# Patient Record
Sex: Male | Born: 1955 | Race: White | Hispanic: No | Marital: Married | Smoking: Never smoker
Health system: Southern US, Community
[De-identification: ages and names within clinical notes are randomized; demographics above are authoritative.]

## PROBLEM LIST (undated history)

## (undated) DIAGNOSIS — M1990 Unspecified osteoarthritis, unspecified site: Secondary | ICD-10-CM

## (undated) HISTORY — PX: COSMETIC SURGERY: SHX468

## (undated) HISTORY — DX: Unspecified osteoarthritis, unspecified site: M19.90

## (undated) HISTORY — PX: HERNIA REPAIR: SHX51

## (undated) HISTORY — PX: BACK SURGERY: SHX140

## (undated) HISTORY — PX: VARICOCELE EXCISION: SUR582

---

## 2005-09-09 ENCOUNTER — Emergency Department (HOSPITAL_COMMUNITY): Admission: EM | Admit: 2005-09-09 | Discharge: 2005-09-10 | Payer: Self-pay | Admitting: Emergency Medicine

## 2006-03-20 ENCOUNTER — Emergency Department (HOSPITAL_COMMUNITY): Admission: EM | Admit: 2006-03-20 | Discharge: 2006-03-20 | Payer: Self-pay | Admitting: Family Medicine

## 2006-04-09 ENCOUNTER — Emergency Department (HOSPITAL_COMMUNITY): Admission: EM | Admit: 2006-04-09 | Discharge: 2006-04-09 | Payer: Self-pay | Admitting: Emergency Medicine

## 2008-05-12 ENCOUNTER — Emergency Department (HOSPITAL_COMMUNITY): Admission: EM | Admit: 2008-05-12 | Discharge: 2008-05-12 | Payer: Self-pay | Admitting: Emergency Medicine

## 2008-05-16 ENCOUNTER — Encounter: Admission: RE | Admit: 2008-05-16 | Discharge: 2008-05-16 | Payer: Self-pay | Admitting: Orthopedic Surgery

## 2008-05-17 ENCOUNTER — Encounter: Admission: RE | Admit: 2008-05-17 | Discharge: 2008-05-17 | Payer: Self-pay | Admitting: Orthopedic Surgery

## 2008-07-11 ENCOUNTER — Ambulatory Visit: Payer: Self-pay | Admitting: Family Medicine

## 2008-07-11 DIAGNOSIS — M1990 Unspecified osteoarthritis, unspecified site: Secondary | ICD-10-CM | POA: Insufficient documentation

## 2008-07-12 ENCOUNTER — Ambulatory Visit: Payer: Self-pay | Admitting: Family Medicine

## 2008-07-13 LAB — CONVERTED CEMR LAB
ALT: 27 units/L (ref 0–53)
AST: 23 units/L (ref 0–37)
Albumin: 3.8 g/dL (ref 3.5–5.2)
Alkaline Phosphatase: 56 units/L (ref 39–117)
BUN: 13 mg/dL (ref 6–23)
Bilirubin, Direct: 0.1 mg/dL (ref 0.0–0.3)
CO2: 32 meq/L (ref 19–32)
Calcium: 8.9 mg/dL (ref 8.4–10.5)
Chloride: 105 meq/L (ref 96–112)
Cholesterol: 141 mg/dL (ref 0–200)
Creatinine, Ser: 0.9 mg/dL (ref 0.4–1.5)
GFR calc Af Amer: 114 mL/min
GFR calc non Af Amer: 95 mL/min
Glucose, Bld: 89 mg/dL (ref 70–99)
HDL: 39.4 mg/dL (ref >39.0)
LDL Cholesterol: 90 mg/dL (ref 0–99)
Potassium: 4.1 meq/L (ref 3.5–5.1)
Sodium: 142 meq/L (ref 135–145)
Total Bilirubin: 0.8 mg/dL (ref 0.3–1.2)
Total CHOL/HDL Ratio: 3.6
Total Protein: 6.4 g/dL (ref 6.0–8.3)
Triglycerides: 57 mg/dL (ref 0–149)
VLDL: 11 mg/dL (ref 0–40)

## 2008-07-22 ENCOUNTER — Encounter: Payer: Self-pay | Admitting: Family Medicine

## 2008-07-28 ENCOUNTER — Encounter: Payer: Self-pay | Admitting: Family Medicine

## 2008-07-28 LAB — HM COLONOSCOPY: HM Colonoscopy: ABNORMAL

## 2008-07-29 ENCOUNTER — Encounter: Payer: Self-pay | Admitting: Family Medicine

## 2008-08-05 ENCOUNTER — Encounter: Admission: RE | Admit: 2008-08-05 | Discharge: 2008-08-05 | Payer: Self-pay | Admitting: Orthopedic Surgery

## 2009-03-03 ENCOUNTER — Ambulatory Visit: Payer: Self-pay | Admitting: Family Medicine

## 2009-03-03 LAB — CONVERTED CEMR LAB
Bilirubin Urine: NEGATIVE
Blood in Urine, dipstick: NEGATIVE
Glucose, Urine, Semiquant: NEGATIVE
Ketones, urine, test strip: NEGATIVE
Nitrite: NEGATIVE
Specific Gravity, Urine: 1.02
Urobilinogen, UA: 0.2
WBC Urine, dipstick: NEGATIVE
pH: 6.5

## 2009-04-05 ENCOUNTER — Ambulatory Visit: Payer: Self-pay | Admitting: Family Medicine

## 2009-04-06 ENCOUNTER — Encounter: Payer: Self-pay | Admitting: Family Medicine

## 2009-09-08 ENCOUNTER — Telehealth: Payer: Self-pay | Admitting: Family Medicine

## 2009-09-15 ENCOUNTER — Ambulatory Visit: Payer: Self-pay | Admitting: Family Medicine

## 2009-09-15 LAB — CONVERTED CEMR LAB
ALT: 30 units/L (ref 0–53)
AST: 23 units/L (ref 0–37)
Albumin: 4.1 g/dL (ref 3.5–5.2)
Alkaline Phosphatase: 55 units/L (ref 39–117)
BUN: 16 mg/dL (ref 6–23)
Bilirubin, Direct: 0.1 mg/dL (ref 0.0–0.3)
CO2: 32 meq/L (ref 19–32)
Calcium: 9.4 mg/dL (ref 8.4–10.5)
Chloride: 105 meq/L (ref 96–112)
Cholesterol: 149 mg/dL (ref 0–200)
Creatinine, Ser: 1.1 mg/dL (ref 0.4–1.5)
GFR calc non Af Amer: 74.62 mL/min (ref >60)
Glucose, Bld: 87 mg/dL (ref 70–99)
HDL: 47.4 mg/dL (ref >39.00)
LDL Cholesterol: 92 mg/dL (ref 0–99)
Potassium: 4.3 meq/L (ref 3.5–5.1)
Sodium: 141 meq/L (ref 135–145)
Total Bilirubin: 1.3 mg/dL — ABNORMAL HIGH (ref 0.3–1.2)
Total CHOL/HDL Ratio: 3
Total Protein: 6.8 g/dL (ref 6.0–8.3)
Triglycerides: 48 mg/dL (ref 0.0–149.0)
VLDL: 9.6 mg/dL (ref 0.0–40.0)

## 2009-09-22 ENCOUNTER — Ambulatory Visit: Payer: Self-pay | Admitting: Family Medicine

## 2009-09-22 DIAGNOSIS — I7300 Raynaud's syndrome without gangrene: Secondary | ICD-10-CM | POA: Insufficient documentation

## 2010-10-05 ENCOUNTER — Telehealth (INDEPENDENT_AMBULATORY_CARE_PROVIDER_SITE_OTHER): Payer: Self-pay | Admitting: *Deleted

## 2010-10-09 ENCOUNTER — Ambulatory Visit
Admission: RE | Admit: 2010-10-09 | Discharge: 2010-10-09 | Payer: Self-pay | Source: Home / Self Care | Attending: Family Medicine | Admitting: Family Medicine

## 2010-10-09 ENCOUNTER — Telehealth: Payer: Self-pay | Admitting: Family Medicine

## 2010-10-09 LAB — CONVERTED CEMR LAB
AST: 26 units/L (ref 0–37)
Alkaline Phosphatase: 62 units/L (ref 39–117)
Bilirubin, Direct: 0.2 mg/dL (ref 0.0–0.3)
CO2: 27 meq/L (ref 19–32)
Calcium: 9.1 mg/dL (ref 8.4–10.5)
Chloride: 105 meq/L (ref 96–112)
Creatinine, Ser: 0.8 mg/dL (ref 0.4–1.5)
GFR calc non Af Amer: 102.86 mL/min (ref >60.00)
HDL: 40.9 mg/dL (ref >39.00)
PSA: 0.83 ng/mL (ref 0.10–4.00)
Potassium: 4.3 meq/L (ref 3.5–5.1)
Sodium: 141 meq/L (ref 135–145)
Total CHOL/HDL Ratio: 4
Total Protein: 6.8 g/dL (ref 6.0–8.3)
Triglycerides: 64 mg/dL (ref 0.0–149.0)
Uric Acid, Serum: 4.7 mg/dL (ref 4.0–7.8)
VLDL: 12.8 mg/dL (ref 0.0–40.0)

## 2010-10-12 ENCOUNTER — Ambulatory Visit
Admission: RE | Admit: 2010-10-12 | Discharge: 2010-10-12 | Payer: Self-pay | Source: Home / Self Care | Attending: Family Medicine | Admitting: Family Medicine

## 2010-10-12 DIAGNOSIS — K921 Melena: Secondary | ICD-10-CM | POA: Insufficient documentation

## 2010-10-12 DIAGNOSIS — M654 Radial styloid tenosynovitis [de Quervain]: Secondary | ICD-10-CM | POA: Insufficient documentation

## 2010-10-12 LAB — CONVERTED CEMR LAB
Cholesterol, target level: 200 mg/dL
LDL Goal: 160 mg/dL

## 2010-11-08 NOTE — Progress Notes (Signed)
----   Converted from flag ---- ---- 10/04/2010 10:22 PM, Kerby Nora MD wrote: PSA Dx v76.44, CMET, lipids Dx v77.91  ---- 10/03/2010 9:59 AM, Liane Comber CMA (AAMA) wrote: Lab orders please! Good Morning! This pt is scheduled for cpx labs Monday, which labs to draw and dx codes to use? Thanks Tasha ------------------------------
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
## 2010-11-08 NOTE — Progress Notes (Signed)
Summary: add uric acid  ---- Converted from flag ---- ---- 10/09/2010 9:45 AM, Kerby Nora MD wrote: Dx 729.5 uric acid  ---- 10/09/2010 9:20 AM, Melody Comas wrote: Patient came in for labs this morning and he is asking if we could add a uric acid to his order  because he has been having trouble with his feet and he wants to make sure it's not gout. Please advise. ------------------------------

## 2011-09-27 ENCOUNTER — Encounter: Payer: Self-pay | Admitting: Family Medicine

## 2011-09-27 LAB — PSA: PSA: 1.17 ng/mL

## 2011-10-11 ENCOUNTER — Telehealth: Payer: Self-pay | Admitting: Family Medicine

## 2011-10-11 ENCOUNTER — Other Ambulatory Visit (INDEPENDENT_AMBULATORY_CARE_PROVIDER_SITE_OTHER): Payer: BC Managed Care – PPO

## 2011-10-11 DIAGNOSIS — Z125 Encounter for screening for malignant neoplasm of prostate: Secondary | ICD-10-CM

## 2011-10-11 DIAGNOSIS — Z13220 Encounter for screening for lipoid disorders: Secondary | ICD-10-CM

## 2011-10-11 LAB — COMPREHENSIVE METABOLIC PANEL
ALT: 28 U/L (ref 0–53)
Albumin: 4 g/dL (ref 3.5–5.2)
BUN: 16 mg/dL (ref 6–23)
CO2: 30 mEq/L (ref 19–32)
Calcium: 9.2 mg/dL (ref 8.4–10.5)
Chloride: 105 mEq/L (ref 96–112)
Creatinine, Ser: 0.9 mg/dL (ref 0.4–1.5)
GFR: 92.15 mL/min (ref >60.00)
Glucose, Bld: 92 mg/dL (ref 70–99)
Potassium: 4.4 mEq/L (ref 3.5–5.1)
Sodium: 140 mEq/L (ref 135–145)
Total Bilirubin: 0.7 mg/dL (ref 0.3–1.2)
Total Protein: 6.9 g/dL (ref 6.0–8.3)

## 2011-10-11 LAB — LIPID PANEL
Cholesterol: 165 mg/dL (ref 0–200)
HDL: 50 mg/dL (ref >39.00)
Total CHOL/HDL Ratio: 3
Triglycerides: 69 mg/dL (ref 0.0–149.0)
VLDL: 13.8 mg/dL (ref 0.0–40.0)

## 2011-10-11 NOTE — Telephone Encounter (Signed)
Message copied by Excell Seltzer on Fri Oct 11, 2011  7:08 AM ------      Message from: Mills Koller J      Created: Wed Oct 09, 2011  2:46 PM      Regarding: labs for 10-11-11       Patient is scheduled for CPX labs, please order future labs, Thanks , Camelia Eng

## 2011-10-14 ENCOUNTER — Ambulatory Visit (INDEPENDENT_AMBULATORY_CARE_PROVIDER_SITE_OTHER): Payer: BC Managed Care – PPO | Admitting: Family Medicine

## 2011-10-14 ENCOUNTER — Encounter: Payer: Self-pay | Admitting: Family Medicine

## 2011-10-14 VITALS — BP 120/70 | HR 58 | Temp 98.0°F | Ht 69.5 in | Wt 172.0 lb

## 2011-10-14 DIAGNOSIS — Z0000 Encounter for general adult medical examination without abnormal findings: Secondary | ICD-10-CM
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
## 2011-10-14 NOTE — Patient Instructions (Addendum)
Work on getting regular low impact exercise as tolerated. Work on healthy eating habits. Follow up in 1 year for CPX.

## 2012-02-05 HISTORY — PX: OTHER SURGICAL HISTORY: SHX169

## 2012-09-29 ENCOUNTER — Ambulatory Visit (INDEPENDENT_AMBULATORY_CARE_PROVIDER_SITE_OTHER): Payer: BC Managed Care – PPO | Admitting: Physician Assistant

## 2012-09-29 VITALS — BP 126/81 | HR 76 | Temp 98.1°F | Resp 16 | Ht 68.5 in | Wt 168.0 lb

## 2012-09-29 DIAGNOSIS — J019 Acute sinusitis, unspecified: Secondary | ICD-10-CM

## 2012-09-29 MED ORDER — AZITHROMYCIN 250 MG PO TABS: ORAL_TABLET | ORAL | Status: DC

## 2012-09-29 MED ORDER — IPRATROPIUM BROMIDE 0.06 % NA SOLN: 2.0000 | Freq: Three times a day (TID) | NASAL | Status: DC

## 2012-09-29 NOTE — Progress Notes (Signed)
Patient ID: GABRIEL CONRY MRN: [id number], DOB: 1956-03-28, 56 y.o. Date of Encounter: 09/29/2012, 3:55 PM  Primary Physician: Kerby Nora, MD  Chief Complaint:  Chief Complaint  Patient presents with  . Nasal Congestion  . Headache    HPI: 56 y.o. year old male presents with a four day history of nasal congestion, post nasal drip, sore throat, sinus pressure, and cough. Afebrile. No chills. Nasal congestion thick and green/yellow. Sinus pressure is the worst symptom. Cough is productive secondary to post nasal drip and not associated with time of day. Ears feel full, leading to sensation of muffled hearing. Has tried OTC cold preps without success. No GI complaints. Appetite normal.  No recent antibiotics, recent travels, or sick contacts   No leg trauma, sedentary periods, h/o cancer, or tobacco use.  Past Medical History  Diagnosis Date  . Arthritis      Home Meds: Prior to Admission medications   Medication Sig Start Date End Date Taking? Authorizing Provider  Ascorbic Acid (VITAMIN C) 1000 MG tablet Take 1,000 mg by mouth daily.     Yes Historical Provider, MD  fish oil-omega-3 fatty acids 1000 MG capsule Take 1 g by mouth daily.     Yes Historical Provider, MD  glucosamine-chondroitin 500-400 MG tablet Take 1 tablet by mouth 3 (three) times daily.     Yes Historical Provider, MD  Multiple Vitamin (MULTIVITAMIN) tablet Take 1 tablet by mouth daily.     Yes Historical Provider, MD                  Allergies:  Allergies  Allergen Reactions  . Sulfonamide Derivatives     REACTION: Blisters in mouth and on tongue    History   Social History  . Marital Status: Married    Spouse Name: N/A    Number of Children: N/A  . Years of Education: N/A   Occupational History  . Not on file.   Social History Main Topics  . Smoking status: Never Smoker   . Smokeless tobacco: Not on file  . Alcohol Use: No  . Drug Use: No  . Sexually Active: Not on file   Other  Topics Concern  . Not on file   Social History Narrative   Regular exercise-walks someDiet: healthy, veggies and fruit     Review of Systems: Constitutional: negative for chills, fever, night sweats or weight changes HEENT: see above Cardiovascular: negative for chest pain or palpitations Respiratory: negative for hemoptysis, wheezing, or shortness of breath Abdominal: negative for abdominal pain, nausea, vomiting or diarrhea Dermatological: negative for rash Neurologic: negative for headache   Physical Exam: Blood pressure 149/83, pulse 76, temperature 98.1 F (36.7 C), temperature source Oral, resp. rate 16, height 5' 8.5" (1.74 m), weight 168 lb (76.204 kg), SpO2 96.00%., Body mass index is 25.17 kg/(m^2). BP rechecked at 126/81 General: Well developed, well nourished, in no acute distress. Head: Normocephalic, atraumatic, eyes without discharge, sclera non-icteric, nares are congested. Bilateral auditory canals clear, TM's are without perforation, pearly grey with reflective cone of light bilaterally. Serous effusion bilaterally behind TM's. Maxillary sinus TTP. Oral cavity moist, dentition normal. Posterior pharynx with post nasal drip and mild erythema. No peritonsillar abscess or tonsillar exudate. Neck: Supple. No thyromegaly. Full ROM. No lymphadenopathy. Lungs: Clear bilaterally to auscultation without wheezes, rales, or rhonchi. Breathing is unlabored.  Heart: RRR with S1 S2. No murmurs, rubs, or gallops appreciated. Msk:  Strength and tone normal for age. Extremities: No  clubbing or cyanosis. No edema. Neuro: Alert and oriented X 3. Moves all extremities spontaneously. CNII-XII grossly in tact. Psych:  Responds to questions appropriately with a normal affect.     ASSESSMENT AND PLAN:  56 y.o. year old male with sinusitis -Azithromycin 250 MG #6 2 po first day then 1 po next 4 days no RF -Atrovent NS 0.06% 2 sprays each nare bid prn #1 no RF -Mucinex -Tylenol/Motrin  prn -Take probiotics -Rest/fluids -RTC precautions -RTC 3-5 days if no improvement  Signed, Eula Listen, PA-C 09/29/2012 3:55 PM

## 2012-10-07 ENCOUNTER — Emergency Department (HOSPITAL_COMMUNITY): Payer: BC Managed Care – PPO

## 2012-10-07 ENCOUNTER — Encounter (HOSPITAL_COMMUNITY): Payer: Self-pay | Admitting: *Deleted

## 2012-10-07 ENCOUNTER — Emergency Department (HOSPITAL_COMMUNITY)
Admission: EM | Admit: 2012-10-07 | Discharge: 2012-10-07 | Disposition: A | Discharge status: Nursing Home (Includes ICF) | Payer: BC Managed Care – PPO | Source: Home / Self Care | Attending: Family Medicine | Admitting: Family Medicine

## 2012-10-07 ENCOUNTER — Emergency Department (HOSPITAL_COMMUNITY)
Admission: EM | Admit: 2012-10-07 | Discharge: 2012-10-07 | Disposition: A | Discharge status: Home / Self Care | Payer: BC Managed Care – PPO | Attending: Emergency Medicine | Admitting: Emergency Medicine

## 2012-10-07 DIAGNOSIS — Z79899 Other long term (current) drug therapy: Secondary | ICD-10-CM | POA: Insufficient documentation

## 2012-10-07 DIAGNOSIS — R109 Unspecified abdominal pain: Secondary | ICD-10-CM | POA: Insufficient documentation

## 2012-10-07 DIAGNOSIS — M129 Arthropathy, unspecified: Secondary | ICD-10-CM | POA: Insufficient documentation

## 2012-10-07 DIAGNOSIS — N200 Calculus of kidney: Secondary | ICD-10-CM | POA: Insufficient documentation

## 2012-10-07 DIAGNOSIS — Z8739 Personal history of other diseases of the musculoskeletal system and connective tissue: Secondary | ICD-10-CM | POA: Insufficient documentation

## 2012-10-07 DIAGNOSIS — R52 Pain, unspecified: Secondary | ICD-10-CM

## 2012-10-07 DIAGNOSIS — N23 Unspecified renal colic: Secondary | ICD-10-CM | POA: Insufficient documentation

## 2012-10-07 LAB — POCT URINALYSIS DIP (DEVICE)
Bilirubin Urine: NEGATIVE
Glucose, UA: NEGATIVE mg/dL
Hgb urine dipstick: NEGATIVE
Ketones, ur: NEGATIVE mg/dL
Leukocytes, UA: NEGATIVE
Nitrite: NEGATIVE
Protein, ur: NEGATIVE mg/dL
Specific Gravity, Urine: 1.025 (ref 1.005–1.030)
Urobilinogen, UA: 0.2 mg/dL (ref 0.0–1.0)
pH: 6 (ref 5.0–8.0)

## 2012-10-07 MED ORDER — ONDANSETRON 4 MG PO TBDP
4.0000 mg | ORAL_TABLET | Freq: Once | ORAL | Status: AC
  Administered 2012-10-07: 4 mg via ORAL
  Filled 2012-10-07: qty 1

## 2012-10-07 MED ORDER — OXYCODONE-ACETAMINOPHEN 5-325 MG PO TABS: 1.0000 | ORAL_TABLET | Freq: Four times a day (QID) | ORAL | Status: DC | PRN

## 2012-10-07 MED ORDER — ONDANSETRON 4 MG PO TBDP
8.0000 mg | ORAL_TABLET | Freq: Once | ORAL | Status: AC
  Administered 2012-10-07: 8 mg via ORAL

## 2012-10-07 MED ORDER — SODIUM CHLORIDE 0.9 % IV SOLN
INTRAVENOUS | Status: DC
  Administered 2012-10-07: 20 mL/h via INTRAVENOUS

## 2012-10-07 MED ORDER — ONDANSETRON 4 MG PO TBDP
ORAL_TABLET | ORAL | Status: AC
  Filled 2012-10-07: qty 2

## 2012-10-07 MED ORDER — KETOROLAC TROMETHAMINE 30 MG/ML IJ SOLN
30.0000 mg | Freq: Once | INTRAMUSCULAR | Status: AC
  Administered 2012-10-07: 30 mg via INTRAVENOUS
  Filled 2012-10-07: qty 1

## 2012-10-07 MED ORDER — ONDANSETRON 8 MG PO TBDP: ORAL_TABLET | ORAL | Status: DC

## 2012-10-07 MED ORDER — METOCLOPRAMIDE HCL 10 MG PO TABS: 10.0000 mg | ORAL_TABLET | Freq: Four times a day (QID) | ORAL | Status: DC | PRN

## 2012-10-07 MED ORDER — KETOROLAC TROMETHAMINE 30 MG/ML IJ SOLN
60.0000 mg | Freq: Once | INTRAMUSCULAR | Status: AC
  Administered 2012-10-07: 60 mg via INTRAMUSCULAR

## 2012-10-07 MED ORDER — OXYCODONE-ACETAMINOPHEN 5-325 MG PO TABS
2.0000 | ORAL_TABLET | Freq: Once | ORAL | Status: AC
  Administered 2012-10-07: 2 via ORAL
  Filled 2012-10-07: qty 2

## 2012-10-07 MED ORDER — HYDROMORPHONE HCL PF 1 MG/ML IJ SOLN
1.0000 mg | Freq: Once | INTRAMUSCULAR | Status: AC
  Administered 2012-10-07: 1 mg via INTRAVENOUS
  Filled 2012-10-07: qty 1

## 2012-10-07 MED ORDER — KETOROLAC TROMETHAMINE 60 MG/2ML IM SOLN
INTRAMUSCULAR | Status: AC
  Filled 2012-10-07: qty 2

## 2012-10-07 NOTE — ED Provider Notes (Signed)
I saw and evaluated the patient, reviewed the resident's note and I agree with the findings and plan.  Recurrent renal colic now better in ED.  Hurman Horn, MD 10/08/12 2256

## 2012-10-07 NOTE — ED Notes (Signed)
Pt  Reports  l  Flank    Pain      Started  yest       Pt  Felt  Some  Pressure     As  Well      In  The      l  Side        Pt    denys  Any  History  Of  Any  Kidney  Stones   Or  Ant  Other  Medical  Problems

## 2012-10-07 NOTE — ED Notes (Signed)
Pt states understanding of discharge instruction 

## 2012-10-07 NOTE — ED Notes (Signed)
Pt states that he was seen here earlier for flank pain, told he had a kidney stone and was rxed percocet. Pt states that the percocet isn't helping. Pt still having left flank pain. Pt able to eat and drink and urinate. Pt states some nausea as well. Pt has appointment with urologist tomorrow.

## 2012-10-07 NOTE — ED Notes (Signed)
Patient reports left flank pain.
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
## 2012-10-07 NOTE — ED Notes (Signed)
MD at bedside. 

## 2012-10-07 NOTE — ED Notes (Signed)
Sent from [facility] for further eval of left flank pain that started yesterday. States urine sample from ucc 'was clean'. No numbness or tingling. Pain is nonradiating. No vomiting. No diarrhea.

## 2012-11-02 ENCOUNTER — Other Ambulatory Visit (INDEPENDENT_AMBULATORY_CARE_PROVIDER_SITE_OTHER): Payer: BC Managed Care – PPO

## 2012-11-02 DIAGNOSIS — Z13220 Encounter for screening for lipoid disorders: Secondary | ICD-10-CM

## 2012-11-02 DIAGNOSIS — Z0000 Encounter for general adult medical examination without abnormal findings: Secondary | ICD-10-CM

## 2012-11-02 DIAGNOSIS — Z131 Encounter for screening for diabetes mellitus: Secondary | ICD-10-CM

## 2012-11-02 LAB — COMPREHENSIVE METABOLIC PANEL
ALT: 28 U/L (ref 0–53)
Albumin: 3.7 g/dL (ref 3.5–5.2)
CO2: 30 mEq/L (ref 19–32)
Calcium: 9.2 mg/dL (ref 8.4–10.5)
Chloride: 104 mEq/L (ref 96–112)
GFR: 88.42 mL/min (ref >60.00)
Potassium: 4.5 mEq/L (ref 3.5–5.1)
Sodium: 140 mEq/L (ref 135–145)
Total Protein: 6.7 g/dL (ref 6.0–8.3)

## 2012-11-10 ENCOUNTER — Encounter: Payer: Self-pay | Admitting: Family Medicine

## 2012-11-10 ENCOUNTER — Ambulatory Visit (INDEPENDENT_AMBULATORY_CARE_PROVIDER_SITE_OTHER): Payer: BC Managed Care – PPO | Admitting: Family Medicine

## 2012-11-10 VITALS — BP 120/78 | HR 70 | Temp 98.2°F | Ht 68.5 in | Wt 172.5 lb

## 2012-11-10 DIAGNOSIS — M19049 Primary osteoarthritis, unspecified hand: Secondary | ICD-10-CM

## 2012-11-10 DIAGNOSIS — M189 Osteoarthritis of first carpometacarpal joint, unspecified: Secondary | ICD-10-CM

## 2012-11-10 DIAGNOSIS — J3489 Other specified disorders of nose and nasal sinuses: Secondary | ICD-10-CM

## 2012-11-10 DIAGNOSIS — J019 Acute sinusitis, unspecified: Secondary | ICD-10-CM

## 2012-11-10 DIAGNOSIS — Z0000 Encounter for general adult medical examination without abnormal findings: Secondary | ICD-10-CM

## 2012-11-10 DIAGNOSIS — R0981 Nasal congestion: Secondary | ICD-10-CM | POA: Insufficient documentation

## 2012-11-10 MED ORDER — AZITHROMYCIN 250 MG PO TABS: ORAL_TABLET | ORAL | Status: DC

## 2012-11-10 MED ORDER — IPRATROPIUM BROMIDE 0.06 % NA SOLN: 2.0000 | Freq: Three times a day (TID) | NASAL | Status: DC

## 2012-11-10 NOTE — Progress Notes (Signed)
The patient is here for annual wellness exam and preventative care.   B sore thumbs, CMC joint for a few month. Occ popping.   2 days of congestion, sinus pressure.  Recent sinus infection in 09/2012.. Treated with azithromycin. He wants refill of atrovent nasal spray. Has history of frequent sinus infection.   He is requesting an antibiotic prescription to hold onto if he gets  Chronic right knee, small meniscal tear, ACL injury: stable followed by Dr. Thomasena Edis. No surgery indicated  Seeing [facility] podiatry for foot pain... Hallux rigidus: had surgery last 02/2012. Removed bone spur.  Exercise: walking at work a lot, able to exercsise more Diet: healthy   Reviewed labs in detail.. Chol and BP at goal.  Lab Results  Component Value Date   CHOL 147 11/02/2012   HDL 44.10 11/02/2012   LDLCALC 87 11/02/2012   TRIG 78.0 11/02/2012   CHOLHDL 3 11/02/2012     Review of Systems  Constitutional: Negative for fever, fatigue and unexpected weight change.  HENT: Negative for ear pain, congestion, sore throat, rhinorrhea, trouble swallowing and postnasal drip.  Eyes: Negative for pain.  Respiratory: Negative for cough, shortness of breath and wheezing.  Cardiovascular: Negative for chest pain, palpitations and leg swelling.  Gastrointestinal: Negative for nausea, abdominal pain, diarrhea, constipation and blood in stool.  Genitourinary: Negative for dysuria, urgency, hematuria, discharge, penile swelling, scrotal swelling, difficulty urinating, penile pain and testicular pain.  Musculoskeletal: No further back pain. Skin: Negative for rash.  Neurological: Negative for syncope, weakness, light-headedness, numbness and headaches.  Psychiatric/Behavioral: Negative for behavioral problems and dysphoric mood. The patient is not nervous/anxious.    Objective:   Physical Exam  Constitutional: He appears well-developed and well-nourished. Non-toxic appearance. He does not appear ill. No distress.   HENT:  Head: Normocephalic and atraumatic.  Right Ear: Hearing, tympanic membrane, external ear and ear canal normal.  Left Ear: Hearing, tympanic membrane, external ear and ear canal normal.  Nose: Nose with congestion, no sinus ttp. Mouth/Throat: Uvula is midline, oropharynx is clear and moist and mucous membranes are normal.  Eyes: Conjunctivae, EOM and lids are normal. Pupils are equal, round, and reactive to light. No foreign bodies found.  Neck: Trachea normal, normal range of motion and phonation normal. Neck supple. Carotid bruit is not present. No mass and no thyromegaly present.  Cardiovascular: Normal rate, regular rhythm, S1 normal, S2 normal, intact distal pulses and normal pulses. Exam reveals no gallop.  No murmur heard.  Pulmonary/Chest: Breath sounds normal. He has no wheezes. He has no rhonchi. He has no rales.  Abdominal: Soft. Normal appearance and bowel sounds are normal. There is no hepatosplenomegaly. There is no tenderness. There is no rebound, no guarding and no CVA tenderness. No hernia.  Genitourinary:  Prostate exam per URO  Lymphadenopathy:  He has no cervical adenopathy.  Neurological: He is alert. He has normal strength and normal reflexes. No cranial nerve deficit or sensory deficit. Gait normal.  Skin: Skin is warm, dry and intact. No rash noted.  Psychiatric: He has a normal mood and affect. His speech is normal and behavior is normal. Judgment normal.  MSK: mildly positive grind test, neg tinel and phalen, neg finkelstein tet, no redness no swelling at [facility] joint.  Assessment & Plan:   CPX: The patient's preventative maintenance and recommended screening tests for an annual wellness exam were reviewed in full today.  Brought up to date unless services declined.  Counselled on the importance of diet, exercise,  and its role in overall health and mortality.  The patient's FH and SH was reviewed, including their home life, tobacco status, and drug and alcohol  status .  Vaccines: Up to date with Td. Refuses flu vaccine.  Colon: polyp in 2009.Marland Kitchen Repeat recommended in 5 years...07/2013  Prostate: PSA stable from last year stable per Dr. Laverle Patter, prostate exam there this year.   Reviewed last OV. Nonsmoker

## 2012-11-10 NOTE — Patient Instructions (Addendum)
Continue working on regular exercise and healthy eating. Call Dr. Madilyn Fireman office to set up repeat colonoscopy for 5 year check given mother with colon cancer. Start treating nasal symptoms with nasal spray, mucinex. If not better after 7-10 days, can fill antibiotics.

## 2012-11-10 NOTE — Assessment & Plan Note (Signed)
Vs tendonitis ( less likely) NSAID, bracing, limit use. Call if not improving for referral to specialist for injections.

## 2012-11-10 NOTE — Assessment & Plan Note (Signed)
Likely allergies vs viral infection. Restart nasal spray, irrigation and mucinex. If not improving can fill antibiotics.

## 2012-12-02 ENCOUNTER — Other Ambulatory Visit: Payer: Self-pay | Admitting: *Deleted

## 2012-12-02 NOTE — Telephone Encounter (Signed)
Ok to fill 

## 2012-12-03 ENCOUNTER — Other Ambulatory Visit: Payer: Self-pay | Admitting: *Deleted

## 2012-12-03 DIAGNOSIS — J019 Acute sinusitis, unspecified: Secondary | ICD-10-CM

## 2012-12-03 MED ORDER — IPRATROPIUM BROMIDE 0.06 % NA SOLN: 2.0000 | Freq: Three times a day (TID) | NASAL | Status: DC

## 2013-03-23 ENCOUNTER — Ambulatory Visit: Payer: BC Managed Care – PPO | Admitting: Family Medicine

## 2013-07-13 ENCOUNTER — Ambulatory Visit (INDEPENDENT_AMBULATORY_CARE_PROVIDER_SITE_OTHER): Payer: BC Managed Care – PPO | Admitting: Family Medicine

## 2013-07-13 ENCOUNTER — Encounter: Payer: Self-pay | Admitting: Family Medicine

## 2013-07-13 VITALS — BP 108/70 | HR 64 | Temp 98.3°F | Ht 68.5 in | Wt 171.5 lb

## 2013-07-13 DIAGNOSIS — M79602 Pain in left arm: Secondary | ICD-10-CM

## 2013-07-13 DIAGNOSIS — IMO0001 Reserved for inherently not codable concepts without codable children: Secondary | ICD-10-CM

## 2013-07-13 DIAGNOSIS — M79609 Pain in unspecified limb: Secondary | ICD-10-CM

## 2013-07-13 MED ORDER — DICLOFENAC SODIUM 75 MG PO TBEC: 75.0000 mg | DELAYED_RELEASE_TABLET | Freq: Two times a day (BID) | ORAL | Status: DC

## 2013-07-13 NOTE — Patient Instructions (Addendum)
Start diclofenac twice daily for pain or use voltaren gel to treat. Gentle stretching. No lifting > 10 lbs in next week.
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
## 2013-07-13 NOTE — Assessment & Plan Note (Signed)
Most likely bicep irritation or tendonitis given repetitive use.  Treat with NSAID.  Gentle stretching and limit lifting.

## 2013-07-15 ENCOUNTER — Ambulatory Visit: Payer: Self-pay | Admitting: Family Medicine

## 2013-11-08 ENCOUNTER — Telehealth: Payer: Self-pay | Admitting: Family Medicine

## 2013-11-08 ENCOUNTER — Other Ambulatory Visit (INDEPENDENT_AMBULATORY_CARE_PROVIDER_SITE_OTHER): Payer: BC Managed Care – PPO

## 2013-11-08 DIAGNOSIS — Z13220 Encounter for screening for lipoid disorders: Secondary | ICD-10-CM

## 2013-11-08 DIAGNOSIS — IMO0001 Reserved for inherently not codable concepts without codable children: Secondary | ICD-10-CM

## 2013-11-08 DIAGNOSIS — Z125 Encounter for screening for malignant neoplasm of prostate: Secondary | ICD-10-CM

## 2013-11-08 LAB — COMPREHENSIVE METABOLIC PANEL
ALT: 23 U/L (ref 0–53)
AST: 22 U/L (ref 0–37)
Albumin: 3.8 g/dL (ref 3.5–5.2)
Alkaline Phosphatase: 60 U/L (ref 39–117)
BUN: 20 mg/dL (ref 6–23)
CALCIUM: 9.2 mg/dL (ref 8.4–10.5)
CHLORIDE: 108 meq/L (ref 96–112)
CO2: 27 mEq/L (ref 19–32)
CREATININE: 0.9 mg/dL (ref 0.4–1.5)
GFR: 91.45 mL/min (ref >60.00)
Glucose, Bld: 86 mg/dL (ref 70–99)
POTASSIUM: 4.6 meq/L (ref 3.5–5.1)
SODIUM: 141 meq/L (ref 135–145)
TOTAL PROTEIN: 6.6 g/dL (ref 6.0–8.3)
Total Bilirubin: 0.7 mg/dL (ref 0.3–1.2)

## 2013-11-08 LAB — LIPID PANEL
CHOL/HDL RATIO: 3
Cholesterol: 152 mg/dL (ref 0–200)
HDL: 45.8 mg/dL (ref >39.00)
LDL CALC: 92 mg/dL (ref 0–99)
TRIGLYCERIDES: 69 mg/dL (ref 0.0–149.0)
VLDL: 13.8 mg/dL (ref 0.0–40.0)

## 2013-11-08 LAB — TESTOSTERONE: Testosterone: 287.72 ng/dL — ABNORMAL LOW (ref 350.00–890.00)

## 2013-11-08 NOTE — Telephone Encounter (Signed)
Message copied by Excell SeltzerBEDSOLE, Lyris Hitchman E on Mon Nov 08, 2013  8:23 AM ------      Message from: Alvina ChouWALSH, TERRI J      Created: Tue Nov 02, 2013 12:43 PM      Regarding: Lab orders for Monday, 2.2.15       Patient is scheduled for [facility] labs, please order future labs, Thanks , Terri       ------

## 2013-11-16 ENCOUNTER — Encounter: Payer: Self-pay | Admitting: Family Medicine

## 2013-11-16 ENCOUNTER — Ambulatory Visit (INDEPENDENT_AMBULATORY_CARE_PROVIDER_SITE_OTHER): Payer: BC Managed Care – PPO | Admitting: Family Medicine

## 2013-11-16 VITALS — BP 110/72 | HR 70 | Temp 98.3°F | Ht 68.75 in | Wt 169.0 lb

## 2013-11-16 DIAGNOSIS — Z0000 Encounter for general adult medical examination without abnormal findings: Secondary | ICD-10-CM

## 2013-11-16 DIAGNOSIS — M545 Low back pain, unspecified: Secondary | ICD-10-CM | POA: Insufficient documentation

## 2013-11-16 DIAGNOSIS — M25539 Pain in unspecified wrist: Secondary | ICD-10-CM

## 2013-11-16 DIAGNOSIS — M25532 Pain in left wrist: Secondary | ICD-10-CM

## 2013-11-16 MED ORDER — DICLOFENAC SODIUM 75 MG PO TBEC: 75.0000 mg | DELAYED_RELEASE_TABLET | Freq: Two times a day (BID) | ORAL | Status: DC

## 2013-11-16 NOTE — Assessment & Plan Note (Signed)
Liekly traumatic OA. Treat with NSAIDs.

## 2013-11-16 NOTE — Progress Notes (Signed)
The patient is here for annual wellness exam and preventative care.   Seen in 07/2014 for left arm pain: treated with diclofenac. Wearing brace to protect arm. Improved.   2 weeks ago with lifting... 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 Pain occurs off and on.  No radiating pain.  No weakness or numbness in legs. Ibuprofen helps a little. No fever, no dysuria.  Pain in left wrist x 3 weeks on heel of pain and left ulnar wrist (stinging and burning feeling). Sometimes feels like wrist catches.  No numbness, no weakness.  Hx of broken wrist  Age 58.  Chronic right knee, small meniscal tear, ACL injury: stable followed by Dr. Thomasena Edis. No surgery indicated   Seeing [facility] podiatry for foot pain... Hallux rigidus: had surgery last 02/2012. Removed bone spur.  Better now.   Exercise: walking at work a lot, able to exercise more  Diet: healthy   Reviewed labs in detail.. Chol and BP at goal.  BP Readings from Last 3 Encounters:  11/16/13 110/72  07/13/13 108/70  11/10/12 120/78   Lab Results  Component Value Date   CHOL 152 11/08/2013   HDL 45.80 11/08/2013   LDLCALC 92 11/08/2013   TRIG 69.0 11/08/2013   CHOLHDL 3 11/08/2013   Low testosterone: ED.  Energy okay. No desire issues. He is not interested in replacement and rechecking level.  Review of Systems  Constitutional: Negative for fever, fatigue and unexpected weight change.  HENT: Negative for ear pain, congestion, sore throat, rhinorrhea, trouble swallowing and postnasal drip.  Eyes: Negative for pain.  Respiratory: Negative for cough, shortness of breath and wheezing.  Cardiovascular: Negative for chest pain, palpitations and leg swelling.  Gastrointestinal: Negative for nausea, abdominal pain, diarrhea, constipation and blood in stool.  Genitourinary: Negative for dysuria, urgency, hematuria, discharge, penile swelling, scrotal swelling, difficulty urinating, penile pain and testicular pain.  Musculoskeletal:  No further back pain.  Skin: Negative for rash.  Neurological: Negative for syncope, weakness, light-headedness, numbness and headaches.  Psychiatric/Behavioral: Negative for behavioral problems and dysphoric mood. The patient is not nervous/anxious.  Objective:   Physical Exam  Constitutional: He appears well-developed and well-nourished. Non-toxic appearance. He does not appear ill. No distress.  HENT:  Head: Normocephalic and atraumatic.  Right Ear: Hearing, tympanic membrane, external ear and ear canal normal.  Left Ear: Hearing, tympanic membrane, external ear and ear canal normal.  Nose: Nose with congestion, no sinus ttp.  Mouth/Throat: Uvula is midline, oropharynx is clear and moist and mucous membranes are normal.  Eyes: Conjunctivae, EOM and lids are normal. Pupils are equal, round, and reactive to light. No foreign bodies found.  Neck: Trachea normal, normal range of motion and phonation normal. Neck supple. Carotid bruit is not present. No mass and no thyromegaly present.  Cardiovascular: Normal rate, regular rhythm, S1 normal, S2 normal, intact distal pulses and normal pulses. Exam reveals no gallop.  No murmur heard.  Pulmonary/Chest: Breath sounds normal. He has no wheezes. He has no rhonchi. He has no rales.  Abdominal: Soft. Normal appearance and bowel sounds are normal. There is no hepatosplenomegaly. There is no tenderness. There is no rebound, no guarding and no CVA tenderness. No hernia.  Genitourinary:  Prostate exam per URO  Lymphadenopathy:  He has no cervical adenopathy.  Neurological: He is alert. He has normal strength and normal reflexes. No cranial nerve deficit or sensory deficit. Gait normal.  Skin: Skin is warm, dry and intact. No rash noted.  Psychiatric: He has a normal mood and affect. His speech is normal and behavior is normal. Judgment normal.  MSK:  Deformity.. Bony prominence larger at ulnar head. Tttp over are  SLR neg, no vertebra ttp currently,  strength 5/5 uppre and lower ext.  Assessment & Plan:   CPX: The patient's preventative maintenance and recommended screening tests for an annual wellness exam were reviewed in full today.  Brought up to date unless services declined.  Counselled on the importance of diet, exercise, and its role in overall health and mortality.  The patient's FH and SH was reviewed, including their home life, tobacco status, and drug and alcohol status  .  Vaccines: Up to date with Td. Refuses flu vaccine.  Colon: polyp in 2009.Marland Kitchen. Repeat recommended in 5 years...07/2013  Prostate: PSA stable from last year stable  1/05 per Dr. Laverle PatterBorden, prostate exam there this year. Reviewed last OV 10/2013.  Nonsmoker.

## 2013-11-16 NOTE — Progress Notes (Signed)
Pre-visit discussion using our clinic review tool. No additional management support is needed unless otherwise documented below in the visit note.  

## 2013-11-16 NOTE — Assessment & Plan Note (Signed)
MSK strain 

## 2013-11-16 NOTE — Patient Instructions (Addendum)
Call to set up GI colonoscopy when able. Start diclofenac for back and wrist pain. Schedule with your hand specialist if wrist not improving.

## 2014-01-11 ENCOUNTER — Ambulatory Visit (INDEPENDENT_AMBULATORY_CARE_PROVIDER_SITE_OTHER): Payer: BC Managed Care – PPO | Admitting: Emergency Medicine

## 2014-01-11 VITALS — BP 130/70 | HR 75 | Temp 98.4°F | Resp 16 | Ht 69.75 in | Wt 171.0 lb

## 2014-01-11 DIAGNOSIS — J069 Acute upper respiratory infection, unspecified: Secondary | ICD-10-CM

## 2014-01-11 DIAGNOSIS — B9789 Other viral agents as the cause of diseases classified elsewhere: Principal | ICD-10-CM

## 2014-01-11 MED ORDER — BENZONATATE 100 MG PO CAPS: 200.0000 mg | ORAL_CAPSULE | Freq: Three times a day (TID) | ORAL | Status: DC | PRN

## 2014-01-11 MED ORDER — PSEUDOEPHEDRINE HCL 60 MG PO TABS: 60.0000 mg | ORAL_TABLET | ORAL | Status: DC | PRN

## 2014-01-11 NOTE — Patient Instructions (Signed)

## 2014-01-11 NOTE — Progress Notes (Signed)
   Subjective:    Patient ID: Roberto [name], male    DOB: 19-Mar-1956, 58 y.o.   MRN: [id number]  HPI Sinus congestion, runny nose and headache for last 1.5 weeks.  Positive cough as well.  Productive clear sputum.  No vomiting/nausea.  No headache.  No sore throat.  Works as a Naval architecttruck driver.  Denies sick contacts and has been working through this.  PPMH:  Negative for hypertension, COPD  SH:  Nonsmoking. No alcohol.  Review of Systems  Constitutional: Negative for fever and chills.  HENT: Positive for congestion, postnasal drip, rhinorrhea and sinus pressure. Negative for sore throat and trouble swallowing.   Respiratory: Positive for cough. Negative for chest tightness, shortness of breath and wheezing.   Cardiovascular: Negative for chest pain.  Gastrointestinal: Negative for nausea, vomiting, abdominal pain, diarrhea and constipation.  Skin: Negative for rash.  Neurological: Negative for syncope, weakness, numbness and headaches.       Objective:   Physical Exam Blood pressure 130/70, pulse 75, temperature 98.4 F (36.9 C), temperature source Oral, resp. rate 16, height 5' 9.75" (1.772 m), weight 171 lb (77.565 kg), SpO2 99.00%. Body mass index is 24.7 kg/(m^2). Well-developed, well nourished male who is awake, alert and oriented, in NAD. HEENT: [place]/AT, PERRL, EOMI.  Sclera and conjunctiva are clear.  Edematous nasal mucusa.  OP with cobblestoning. Neck: supple, non-tender, no lymphadenopathy, thyromegaly. Heart: RRR, no murmur Lungs: normal effort, CTA Abdomen: normo-active bowel sounds, supple, non-tender, no mass or organomegaly. Extremities: no cyanosis, clubbing or edema. Skin: warm and dry without rash. Psychologic: good mood and appropriate affect, normal speech and behavior.     Assessment & Plan:  Viral URI with cough  Sudafed for rhinorrhea and congestion, teselon perles for cough.  Follow up as needed.

## 2014-01-27 ENCOUNTER — Telehealth: Payer: Self-pay

## 2014-01-27 MED ORDER — AMOXICILLIN-POT CLAVULANATE 875-125 MG PO TABS: 1.0000 | ORAL_TABLET | Freq: Two times a day (BID) | ORAL | Status: DC

## 2014-01-27 NOTE — Telephone Encounter (Signed)
Advised pt rx at pharmacy 

## 2014-01-27 NOTE — Telephone Encounter (Signed)
Patient has sinus infection  [facility] [address]   [phone]

## 2014-01-27 NOTE — Telephone Encounter (Signed)
Augmentin sent to pharmacy.  Take as directed.  Finish the entire course.  RTC if worsening or not improving

## 2014-01-27 NOTE — Telephone Encounter (Signed)
Pt was seen on 4/7 for viral URI- Pt is not getting any better. Please advise if we can send in abx or if pt needs to RTC

## 2014-06-16 ENCOUNTER — Ambulatory Visit (INDEPENDENT_AMBULATORY_CARE_PROVIDER_SITE_OTHER): Payer: BC Managed Care – PPO | Admitting: Family Medicine

## 2014-06-16 ENCOUNTER — Encounter: Payer: Self-pay | Admitting: Family Medicine

## 2014-06-16 VITALS — BP 110/70 | HR 64 | Temp 98.3°F | Ht 69.75 in | Wt 170.5 lb

## 2014-06-16 DIAGNOSIS — L723 Sebaceous cyst: Secondary | ICD-10-CM

## 2014-06-16 DIAGNOSIS — L089 Local infection of the skin and subcutaneous tissue, unspecified: Secondary | ICD-10-CM | POA: Insufficient documentation

## 2014-06-16 MED ORDER — DOXYCYCLINE HYCLATE 100 MG PO CAPS: 100.0000 mg | ORAL_CAPSULE | Freq: Two times a day (BID) | ORAL | Status: DC

## 2014-06-16 NOTE — Patient Instructions (Addendum)
Complete course of doxycycline 100 mg x 10 days. Eat yogurt and start probiotic (lactobaccili). Start warm compresses 15 min three times a day.

## 2014-06-16 NOTE — Progress Notes (Signed)
   Subjective:    Patient ID: Roberto [name], male    DOB: 1956/01/09, 58 y.o.   MRN: [id number]  HPI   58 year old male with DM presents with new onset  Lesion on right hip.  Area was sore 3 weeks ago, red, swollen, tender. No drainage. Lesion decreased in size and no swelling.  Then  flared up in last 5 days. Then better again today. No fever. He has not treated it with anything.   Has history of boils, has not had one since as a child. Daughter with MRSA several years ago.   Review of Systems  Constitutional: Negative for fever and fatigue.  HENT: Negative for ear pain.   Eyes: Negative for pain.  Respiratory: Negative for cough and shortness of breath.   Cardiovascular: Negative for chest pain and leg swelling.  Gastrointestinal: Negative for abdominal pain.       Objective:   Physical Exam  Constitutional: Vital signs are normal. He appears well-developed and well-nourished.  HENT:  Head: Normocephalic.  Right Ear: Hearing normal.  Left Ear: Hearing normal.  Nose: Nose normal.  Mouth/Throat: Oropharynx is clear and moist and mucous membranes are normal.  Neck: Trachea normal. Carotid bruit is not present. No mass and no thyromegaly present.  Cardiovascular: Normal rate, regular rhythm and normal pulses.  Exam reveals no gallop, no distant heart sounds and no friction rub.   No murmur heard. No peripheral edema  Pulmonary/Chest: Effort normal and breath sounds normal. No respiratory distress.  Skin: Skin is warm, dry and intact. No rash noted.     Lima bean size mobile cyst, hyperpigmentation, slight erythema, no warmth, non tender.  Psychiatric: He has a normal mood and affect. His speech is normal and behavior is normal. Thought content normal.          Assessment & Plan:

## 2014-06-16 NOTE — Assessment & Plan Note (Signed)
Treat warm compresses and cover for MRSA given family history with doxy x 10 days.  Given hx of cdiff, take probiotic with antibiotics.

## 2014-06-16 NOTE — Progress Notes (Signed)
Pre visit review using our clinic review tool, if applicable. No additional management support is needed unless otherwise documented below in the visit note. 

## 2014-09-23 ENCOUNTER — Encounter (HOSPITAL_BASED_OUTPATIENT_CLINIC_OR_DEPARTMENT_OTHER)
Admission: AD | Disposition: A | Discharge status: Home / Self Care | Payer: Self-pay | Source: Ambulatory Visit | Attending: Orthopedic Surgery

## 2014-09-23 ENCOUNTER — Other Ambulatory Visit: Payer: Self-pay | Admitting: Orthopedic Surgery

## 2014-09-23 ENCOUNTER — Ambulatory Visit (HOSPITAL_BASED_OUTPATIENT_CLINIC_OR_DEPARTMENT_OTHER)
Admission: AD | Admit: 2014-09-23 | Discharge: 2014-09-23 | Disposition: A | Discharge status: Home / Self Care | Payer: BC Managed Care – PPO | Source: Ambulatory Visit | Attending: Orthopedic Surgery | Admitting: Orthopedic Surgery

## 2014-09-23 ENCOUNTER — Encounter (HOSPITAL_BASED_OUTPATIENT_CLINIC_OR_DEPARTMENT_OTHER): Payer: Self-pay | Admitting: Orthopedic Surgery

## 2014-09-23 DIAGNOSIS — Y929 Unspecified place or not applicable: Secondary | ICD-10-CM | POA: Insufficient documentation

## 2014-09-23 DIAGNOSIS — S60450A Superficial foreign body of right index finger, initial encounter: Secondary | ICD-10-CM | POA: Insufficient documentation

## 2014-09-23 DIAGNOSIS — Y939 Activity, unspecified: Secondary | ICD-10-CM | POA: Insufficient documentation

## 2014-09-23 DIAGNOSIS — X58XXXA Exposure to other specified factors, initial encounter: Secondary | ICD-10-CM | POA: Diagnosis not present

## 2014-09-23 DIAGNOSIS — L03011 Cellulitis of right finger: Secondary | ICD-10-CM | POA: Diagnosis not present

## 2014-09-23 DIAGNOSIS — Y999 Unspecified external cause status: Secondary | ICD-10-CM | POA: Insufficient documentation

## 2014-09-23 HISTORY — PX: I & D EXTREMITY: SHX5045

## 2014-09-23 SURGERY — MINOR IRRIGATION AND DEBRIDEMENT EXTREMITY
Anesthesia: LOCAL | Site: Finger | Laterality: Right

## 2014-09-23 MED ORDER — HYDROCODONE-ACETAMINOPHEN 5-325 MG PO TABS
1.0000 | ORAL_TABLET | Freq: Four times a day (QID) | ORAL | Status: DC | PRN
Start: 2014-09-23 — End: 2014-12-05

## 2014-09-23 MED ORDER — BUPIVACAINE HCL (PF) 0.25 % IJ SOLN
INTRAMUSCULAR | Status: DC | PRN
  Administered 2014-09-23: 5 mL

## 2014-09-23 MED ORDER — CHLORHEXIDINE GLUCONATE 4 % EX LIQD: 60.0000 mL | Freq: Once | CUTANEOUS | Status: DC

## 2014-09-23 MED ORDER — LIDOCAINE HCL (PF) 1 % IJ SOLN
INTRAMUSCULAR | Status: DC | PRN
  Administered 2014-09-23: 5 mL

## 2014-09-23 MED ORDER — CEPHALEXIN 250 MG PO CAPS: 250.0000 mg | ORAL_CAPSULE | Freq: Four times a day (QID) | ORAL | Status: DC

## 2014-09-23 SURGICAL SUPPLY — 41 items
BAG DECANTER FOR FLEXI CONT (MISCELLANEOUS) IMPLANT
BLADE MINI RND TIP GREEN BEAV (BLADE) IMPLANT
BLADE SURG 15 STRL LF DISP TIS (BLADE) ×1 IMPLANT
BLADE SURG 15 STRL SS (BLADE) ×2
BNDG CMPR 9X4 STRL LF SNTH (GAUZE/BANDAGES/DRESSINGS)
BNDG COHESIVE 1X5 TAN STRL LF (GAUZE/BANDAGES/DRESSINGS) ×1 IMPLANT
BNDG COHESIVE 3X5 TAN STRL LF (GAUZE/BANDAGES/DRESSINGS) IMPLANT
BNDG ESMARK 4X9 LF (GAUZE/BANDAGES/DRESSINGS) IMPLANT
BNDG GAUZE ELAST 4 BULKY (GAUZE/BANDAGES/DRESSINGS) IMPLANT
CHLORAPREP W/TINT 26ML (MISCELLANEOUS) ×2 IMPLANT
CORDS BIPOLAR (ELECTRODE) IMPLANT
COVER MAYO STAND STRL (DRAPES) ×2 IMPLANT
CUFF TOURNIQUET SINGLE 18IN (TOURNIQUET CUFF) IMPLANT
DRAIN PENROSE 1/2X12 LTX STRL (WOUND CARE) ×1 IMPLANT
DRAIN PENROSE 1/4X12 LTX STRL (WOUND CARE) IMPLANT
DRAPE SURG 17X23 STRL (DRAPES) ×1 IMPLANT
GAUZE PACKING IODOFORM 1/4X15 (GAUZE/BANDAGES/DRESSINGS) IMPLANT
GAUZE SPONGE 4X4 12PLY STRL (GAUZE/BANDAGES/DRESSINGS) ×2 IMPLANT
GAUZE XEROFORM 1X8 LF (GAUZE/BANDAGES/DRESSINGS) ×2 IMPLANT
GLOVE BIOGEL M STRL SZ7.5 (GLOVE) ×1 IMPLANT
GLOVE BIOGEL PI IND STRL 8 (GLOVE) ×1 IMPLANT
GLOVE BIOGEL PI INDICATOR 8 (GLOVE) ×1
GLOVE SURG ORTHO 8.0 STRL STRW (GLOVE) ×2 IMPLANT
LOOP VESSEL MAXI BLUE (MISCELLANEOUS) IMPLANT
NDL PRECISIONGLIDE 27X1.5 (NEEDLE) IMPLANT
NEEDLE PRECISIONGLIDE 27X1.5 (NEEDLE) ×2 IMPLANT
NS IRRIG 1000ML POUR BTL (IV SOLUTION) ×2 IMPLANT
PACK BASIN DAY SURGERY FS (CUSTOM PROCEDURE TRAY) ×2 IMPLANT
PAD CAST 3X4 CTTN HI CHSV (CAST SUPPLIES) IMPLANT
PADDING CAST ABS 4INX4YD NS (CAST SUPPLIES)
PADDING CAST ABS COTTON 4X4 ST (CAST SUPPLIES) ×1 IMPLANT
PADDING CAST COTTON 3X4 STRL (CAST SUPPLIES)
SPLINT PLASTER CAST XFAST 3X15 (CAST SUPPLIES) IMPLANT
SPLINT PLASTER XTRA FASTSET 3X (CAST SUPPLIES)
SWAB COLLECTION DEVICE MRSA (MISCELLANEOUS) ×1 IMPLANT
SYR BULB 3OZ (MISCELLANEOUS) ×2 IMPLANT
SYR CONTROL 10ML LL (SYRINGE) ×1 IMPLANT
TOWEL OR 17X24 6PK STRL BLUE (TOWEL DISPOSABLE) ×4 IMPLANT
TUBE ANAEROBIC SPECIMEN COL (MISCELLANEOUS) ×1 IMPLANT
TUBE FEEDING 5FR 15 INCH (TUBING) IMPLANT
UNDERPAD 30X30 INCONTINENT (UNDERPADS AND DIAPERS) ×2 IMPLANT

## 2014-09-23 NOTE — Discharge Instructions (Addendum)

## 2014-09-23 NOTE — Op Note (Signed)
Dictation Number [id number]
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
## 2014-09-23 NOTE — Brief Op Note (Signed)
09/23/2014  3:14 PM  PATIENT:  Roberto SitesAlson Khanna [name]  58 y.o. male  PRE-OPERATIVE DIAGNOSIS:  foreign body RIF  POST-OPERATIVE DIAGNOSIS:  foreign body RIF  PROCEDURE:  Procedure(s): MINOR FOREIGN BODY REMOVAL AND INCISION AND DRAINAGE PARONYCHIA RIGHT INDEX FINGER (Right)  SURGEON:  Surgeon(s) and Role:    * Cindee SaltGary Assunta Pupo, MD - Primary  PHYSICIAN ASSISTANT:   ASSISTANTS: none   ANESTHESIA:   local  EBL:     BLOOD ADMINISTERED:none  DRAINS: none   LOCAL MEDICATIONS USED:  BUPIVICAINE  and XYLOCAINE   SPECIMEN:  No Specimen  DISPOSITION OF SPECIMEN:  N/A  COUNTS:  YES  TOURNIQUET:    DICTATION: .Other Dictation: Dictation Number [id number]  PLAN OF CARE: Discharge to home after PACU  PATIENT DISPOSITION:  PACU - hemodynamically stable.

## 2014-09-24 NOTE — Op Note (Signed)
NAME:  Roberto [name], [name]              ACCOUNT NO.:  [id number]  MEDICAL RECORD NO.:  [id number]  LOCATION:                                 FACILITY:  PHYSICIAN:  Cindee SaltGary Drevon Plog, M.D.            DATE OF BIRTH:  DATE OF PROCEDURE: DATE OF DISCHARGE:                              OPERATIVE REPORT   PREOPERATIVE DIAGNOSIS:  Foreign body, paronychia, index finger, right hand.  POSTOPERATIVE DIAGNOSIS:  Foreign body, paronychia, index finger, right hand.  OPERATION:  Removal of nail plate with removal of foreign body in the nail bed, right index finger; irrigation of nail bed with cultures.  SURGEON:  Cindee SaltGary Deshana Rominger, M.D.  ANESTHESIA:  Metacarpal block with 0.25% bupivacaine and 1% Xylocaine, both without epinephrine, approximately 8 mL being used.  HISTORY:  The patient is a 58 year old male who suffered an injury to his right index finger with a foreign body under his nail.  He has had progressive pain and swelling since that time.  He is admitted for removal of the foreign body, incision and drainage, debridement of the area.  Pre, peri, and postoperative course have been discussed along with risks and complications.  He is aware of the possibility of further infection, further procedures being necessary should this not settle with antibiotics following removal.  In the preoperative area, the patient is seen, the extremity marked by both patient and surgeon.  PROCEDURE IN DETAIL:  The patient was brought to the operating room where a metacarpal block was given without difficulty using 0.25% bupivacaine and 1% Xylocaine, both without epinephrine, 8 mL being used. After adequate anesthesia was __________, he was prepped with ChloraPrep and in supine position with the right arm free, a time-out taken before the injection was given.  The nail plate was removed.  A foreign piece of wood was then removed without difficulty.  This measured almost 0.5 inch long.  The area was debrided sharply,  cultures taken.  This was irrigated and packed with Xeroform under the nail fold.  A sterile compressive dressing was applied.  On deflation of the tourniquet, fingers were pinked.  The tourniquet was placed on the index finger only with a Penrose drain.  The patient tolerated the procedure well.  He will be discharged home [facility] Nor[place] and Keflex.          ______________________________ Cindee SaltGary Jovann Luse, M.D.    GK/MEDQ  D:  09/23/2014  T:  09/24/2014  Job:  [id number]

## 2014-09-26 ENCOUNTER — Encounter (HOSPITAL_BASED_OUTPATIENT_CLINIC_OR_DEPARTMENT_OTHER): Payer: Self-pay | Admitting: Orthopedic Surgery

## 2014-09-26 LAB — WOUND CULTURE: GRAM STAIN: NONE SEEN

## 2014-09-28 LAB — ANAEROBIC CULTURE: Gram Stain: NONE SEEN

## 2014-11-14 ENCOUNTER — Other Ambulatory Visit: Payer: Self-pay

## 2014-11-14 ENCOUNTER — Telehealth: Payer: Self-pay | Admitting: Family Medicine

## 2014-11-14 DIAGNOSIS — Z13220 Encounter for screening for lipoid disorders: Secondary | ICD-10-CM

## 2014-11-14 NOTE — Telephone Encounter (Signed)
-----   Message from Alvina Chouerri J Walsh sent at 11/10/2014 10:37 AM EST ----- Regarding: Lab orders for Monday, 2.8.16 Patient is scheduled for CPX labs, please order future labs, Thanks , Camelia Engerri

## 2014-11-21 ENCOUNTER — Encounter: Payer: Self-pay | Admitting: Family Medicine

## 2014-11-21 ENCOUNTER — Other Ambulatory Visit: Payer: Self-pay

## 2014-11-28 ENCOUNTER — Other Ambulatory Visit (INDEPENDENT_AMBULATORY_CARE_PROVIDER_SITE_OTHER): Payer: BLUE CROSS/BLUE SHIELD

## 2014-11-28 DIAGNOSIS — Z13220 Encounter for screening for lipoid disorders: Secondary | ICD-10-CM

## 2014-11-28 LAB — LIPID PANEL
Cholesterol: 157 mg/dL (ref 0–200)
HDL: 48.5 mg/dL (ref >39.00)
LDL CALC: 95 mg/dL (ref 0–99)
NonHDL: 108.5
Total CHOL/HDL Ratio: 3
Triglycerides: 69 mg/dL (ref 0.0–149.0)
VLDL: 13.8 mg/dL (ref 0.0–40.0)

## 2014-11-28 LAB — COMPREHENSIVE METABOLIC PANEL
ALK PHOS: 62 U/L (ref 39–117)
ALT: 21 U/L (ref 0–53)
AST: 20 U/L (ref 0–37)
Albumin: 4 g/dL (ref 3.5–5.2)
BILIRUBIN TOTAL: 0.4 mg/dL (ref 0.2–1.2)
BUN: 17 mg/dL (ref 6–23)
CO2: 31 meq/L (ref 19–32)
CREATININE: 0.96 mg/dL (ref 0.40–1.50)
Calcium: 9.4 mg/dL (ref 8.4–10.5)
Chloride: 105 mEq/L (ref 96–112)
GFR: 85.65 mL/min (ref >60.00)
Glucose, Bld: 96 mg/dL (ref 70–99)
POTASSIUM: 4.5 meq/L (ref 3.5–5.1)
SODIUM: 140 meq/L (ref 135–145)
TOTAL PROTEIN: 6.8 g/dL (ref 6.0–8.3)

## 2014-12-05 ENCOUNTER — Encounter: Payer: Self-pay | Admitting: Family Medicine

## 2014-12-05 ENCOUNTER — Ambulatory Visit (INDEPENDENT_AMBULATORY_CARE_PROVIDER_SITE_OTHER): Payer: BLUE CROSS/BLUE SHIELD | Admitting: Family Medicine

## 2014-12-05 VITALS — BP 114/72 | HR 71 | Temp 98.5°F | Ht 68.5 in | Wt 170.8 lb

## 2014-12-05 DIAGNOSIS — Z0000 Encounter for general adult medical examination without abnormal findings: Secondary | ICD-10-CM

## 2014-12-05 NOTE — Progress Notes (Signed)
Pre visit review using our clinic review tool, if applicable. No additional management support is needed unless otherwise documented below in the visit note. 

## 2014-12-05 NOTE — Progress Notes (Signed)
The patient is here for annual wellness exam and preventative care.   Infected sebaceous cyst on right lateral buttock.. Now resolved.  Occ numbness tingling in right arm, decreasing in frequency. No neck pain, no arm. Lasts 8 sec. Did start several months ago after fingernail removal by Dr. Marjo BickerGuzma. He does crank a landing gear repetitively.  Exercise: walking at work a lot, no additional exercise Diet: moderate, limited fast food   Reviewed labs in detail.. Chol and BP at goal.  BP Readings from Last 3 Encounters:  12/05/14 114/72  09/23/14 147/87  06/16/14 110/70   Lab Results  Component Value Date   CHOL 157 11/28/2014   HDL 48.50 11/28/2014   LDLCALC 95 11/28/2014   TRIG 69.0 11/28/2014   CHOLHDL 3 11/28/2014    Review of Systems  Constitutional: Negative for fever, fatigue and unexpected weight change.  HENT: Negative for ear pain, congestion, sore throat, rhinorrhea, trouble swallowing and postnasal drip.  Eyes: Negative for pain.  Respiratory: Negative for cough, shortness of breath and wheezing.  Cardiovascular: Negative for chest pain, palpitations and leg swelling.  Gastrointestinal: Negative for nausea, abdominal pain, diarrhea, constipation and blood in stool.  Genitourinary: Negative for dysuria, urgency, hematuria, discharge, penile swelling, scrotal swelling, difficulty urinating, penile pain and testicular pain.  Musculoskeletal: No further back pain.  Skin: Negative for rash.  Neurological: Negative for syncope, weakness, light-headedness, numbness and headaches.  Psychiatric/Behavioral: Negative for behavioral problems and dysphoric mood. The patient is not nervous/anxious.  Objective:   Physical Exam  Constitutional: He appears well-developed and well-nourished. Non-toxic appearance. He does not appear ill. No distress.  HENT:  Head: Normocephalic and atraumatic.  Right Ear: Hearing, tympanic membrane, external ear and ear canal normal.   Left Ear: Hearing, tympanic membrane, external ear and ear canal normal.  Nose: Nose with congestion, no sinus ttp.  Mouth/Throat: Uvula is midline, oropharynx is clear and moist and mucous membranes are normal.  Eyes: Conjunctivae, EOM and lids are normal. Pupils are equal, round, and reactive to light. No foreign bodies found.  Neck: Trachea normal, normal range of motion and phonation normal. Neck supple. Carotid bruit is not present. No mass and no thyromegaly present.  Cardiovascular: Normal rate, regular rhythm, S1 normal, S2 normal, intact distal pulses and normal pulses. Exam reveals no gallop.  No murmur heard.  Pulmonary/Chest: Breath sounds normal. He has no wheezes. He has no rhonchi. He has no rales.  Abdominal: Soft. Normal appearance and bowel sounds are normal. There is no hepatosplenomegaly. There is no tenderness. There is no rebound, no guarding and no CVA tenderness. No hernia.  Genitourinary:  Prostate exam per URO  Lymphadenopathy:  He has no cervical adenopathy.  Neurological: He is alert. He has normal strength and normal reflexes. No cranial nerve deficit or sensory deficit. Gait normal.  Skin: Skin is warm, dry and intact. No rash noted.  Psychiatric: He has a normal mood and affect. His speech is normal and behavior is normal. Judgment normal.  MSK: Deformity.. Bony prominence larger at ulnar head. Tttp over are SLR neg, no vertebra ttp currently, strength 5/5 uppre and lower ext.  Assessment & Plan:   CPX: The patient's preventative maintenance and recommended screening tests for an annual wellness exam were reviewed in full today.  Brought up to date unless services declined.  Counselled on the importance of diet, exercise, and its role in overall health and mortality.  The patient's FH and SH was reviewed, including their home life, tobacco  status, and drug and alcohol status  .  Vaccines: Up to date with Td. Refuses flu vaccine.  Colon:  polyp in 2009.Marland Kitchen Repeat recommended in 10 years.  Prostate: PSA  1.02 stable from last year stable per Dr. Laverle Patter, prostate exam there this year.  Nonsmoker.

## 2015-12-29 ENCOUNTER — Telehealth: Payer: Self-pay | Admitting: Family Medicine

## 2015-12-29 DIAGNOSIS — Z125 Encounter for screening for malignant neoplasm of prostate: Secondary | ICD-10-CM

## 2015-12-29 DIAGNOSIS — Z1159 Encounter for screening for other viral diseases: Secondary | ICD-10-CM

## 2015-12-29 DIAGNOSIS — Z13220 Encounter for screening for lipoid disorders: Secondary | ICD-10-CM

## 2015-12-29 NOTE — Telephone Encounter (Signed)
-----   Message from Baldomero LamyNatasha C Chavers sent at 12/22/2015  9:39 AM EDT ----- Regarding: Cpx labs Mon 3/27, need orders. Thanks! :-) Please order  future cpx labs for pt's upcoming lab appt. Thanks Rodney Boozeasha

## 2015-12-29 NOTE — Telephone Encounter (Signed)
Additional lab order entered

## 2016-01-01 ENCOUNTER — Other Ambulatory Visit (INDEPENDENT_AMBULATORY_CARE_PROVIDER_SITE_OTHER): Payer: BLUE CROSS/BLUE SHIELD

## 2016-01-01 ENCOUNTER — Other Ambulatory Visit: Payer: Self-pay | Admitting: Family Medicine

## 2016-01-01 DIAGNOSIS — Z125 Encounter for screening for malignant neoplasm of prostate: Secondary | ICD-10-CM | POA: Diagnosis not present

## 2016-01-01 DIAGNOSIS — Z13220 Encounter for screening for lipoid disorders: Secondary | ICD-10-CM | POA: Diagnosis not present

## 2016-01-01 DIAGNOSIS — Z1159 Encounter for screening for other viral diseases: Secondary | ICD-10-CM

## 2016-01-01 LAB — HEPATITIS C ANTIBODY: HCV AB: NEGATIVE

## 2016-01-01 LAB — LIPID PANEL
Cholesterol: 153 mg/dL (ref 0–200)
HDL: 46.7 mg/dL (ref >39.00)
LDL CALC: 91 mg/dL (ref 0–99)
NonHDL: 106.1
TRIGLYCERIDES: 76 mg/dL (ref 0.0–149.0)
Total CHOL/HDL Ratio: 3
VLDL: 15.2 mg/dL (ref 0.0–40.0)

## 2016-01-01 LAB — COMPREHENSIVE METABOLIC PANEL
ALT: 20 U/L (ref 0–53)
AST: 17 U/L (ref 0–37)
Albumin: 4.1 g/dL (ref 3.5–5.2)
Alkaline Phosphatase: 63 U/L (ref 39–117)
BILIRUBIN TOTAL: 0.7 mg/dL (ref 0.2–1.2)
BUN: 14 mg/dL (ref 6–23)
CHLORIDE: 105 meq/L (ref 96–112)
CO2: 31 mEq/L (ref 19–32)
CREATININE: 0.93 mg/dL (ref 0.40–1.50)
Calcium: 9.4 mg/dL (ref 8.4–10.5)
GFR: 88.51 mL/min (ref >60.00)
Glucose, Bld: 98 mg/dL (ref 70–99)
Potassium: 4.5 mEq/L (ref 3.5–5.1)
Sodium: 140 mEq/L (ref 135–145)
Total Protein: 6.7 g/dL (ref 6.0–8.3)

## 2016-01-01 LAB — PSA: PSA: 1.25 ng/mL (ref 0.10–4.00)

## 2016-01-09 ENCOUNTER — Encounter: Payer: Self-pay | Admitting: Family Medicine

## 2016-01-09 ENCOUNTER — Ambulatory Visit (INDEPENDENT_AMBULATORY_CARE_PROVIDER_SITE_OTHER): Payer: BLUE CROSS/BLUE SHIELD | Admitting: Family Medicine

## 2016-01-09 ENCOUNTER — Ambulatory Visit (INDEPENDENT_AMBULATORY_CARE_PROVIDER_SITE_OTHER)
Admission: RE | Admit: 2016-01-09 | Discharge: 2016-01-09 | Disposition: A | Discharge status: Home / Self Care | Payer: BLUE CROSS/BLUE SHIELD | Source: Ambulatory Visit | Attending: Family Medicine | Admitting: Family Medicine

## 2016-01-09 VITALS — BP 120/72 | HR 68 | Temp 97.7°F | Ht 68.5 in | Wt 167.8 lb

## 2016-01-09 DIAGNOSIS — Z0000 Encounter for general adult medical examination without abnormal findings: Secondary | ICD-10-CM

## 2016-01-09 DIAGNOSIS — R200 Anesthesia of skin: Secondary | ICD-10-CM

## 2016-01-09 DIAGNOSIS — R202 Paresthesia of skin: Secondary | ICD-10-CM

## 2016-01-09 DIAGNOSIS — M25519 Pain in unspecified shoulder: Secondary | ICD-10-CM | POA: Insufficient documentation

## 2016-01-09 DIAGNOSIS — M25511 Pain in right shoulder: Secondary | ICD-10-CM

## 2016-01-09 MED ORDER — DICLOFENAC SODIUM 75 MG PO TBEC: 75.0000 mg | DELAYED_RELEASE_TABLET | Freq: Two times a day (BID) | ORAL | Status: DC

## 2016-01-09 NOTE — Assessment & Plan Note (Signed)
Eval with X-ray, eval neck given tingling. Treat with NSAIDs.

## 2016-01-09 NOTE — Progress Notes (Signed)
The patient is here for annual wellness exam and preventative care.   He has been having issues with his right shoulder since 08/2105.  Started out as tingling, discomfort in upper arm only, gradually improving some.No fall, no injury. 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  Use more with reaching and lifting arm more. No bad enough to use a medicine. No numbness, no weakness. No change with moving neck.    Exercise: walking at work a lot, no additional exercise Diet: moderate, limited fast food   Reviewed labs in detail.. Chol and BP at goal.   BP Readings from Last 3 Encounters:  12/05/14 114/72  09/23/14 147/87  06/16/14 110/70   Wt Readings from Last 3 Encounters:  01/09/16 167 lb 12 oz (76.091 kg)  12/05/14 170 lb 12 oz (77.452 kg)  09/23/14 170 lb (77.111 kg)   Cholesterol excellent control on no med. Lab Results  Component Value Date   CHOL 153 01/01/2016   HDL 46.70 01/01/2016   LDLCALC 91 01/01/2016   TRIG 76.0 01/01/2016   CHOLHDL 3 01/01/2016   Review of Systems  Constitutional: Negative for fever, fatigue and unexpected weight change.  HENT: Negative for ear pain, congestion, sore throat, rhinorrhea, trouble swallowing and postnasal drip.  Eyes: Negative for pain.  Respiratory: Negative for cough, shortness of breath and wheezing.  Cardiovascular: Negative for chest pain, palpitations and leg swelling.  Gastrointestinal: Negative for nausea, abdominal pain, diarrhea, constipation and blood in stool.  Genitourinary: Negative for dysuria, urgency, hematuria, discharge, penile swelling, scrotal swelling, difficulty urinating, penile pain and testicular pain.  Musculoskeletal: No further back pain.  Skin: Negative for rash.  Neurological: Negative for syncope, weakness, light-headedness, numbness and headaches.  Psychiatric/Behavioral: Negative for behavioral problems and dysphoric mood. The patient is not  nervous/anxious.  Objective:   Physical Exam  Constitutional: He appears well-developed and well-nourished. Non-toxic appearance. He does not appear ill. No distress.  HENT:  Head: Normocephalic and atraumatic.  Right Ear: Hearing, tympanic membrane, external ear and ear canal normal.  Left Ear: Hearing, tympanic membrane, external ear and ear canal normal.  Nose: Clear  Mouth/Throat: Uvula is midline, oropharynx is clear and moist and mucous membranes are normal.  Eyes: Conjunctivae, EOM and lids are normal. Pupils are equal, round, and reactive to light. No foreign bodies found.  Neck: Trachea normal, normal range of motion and phonation normal. Neck supple. Carotid bruit is not present. No mass and no thyromegaly present.  Cardiovascular: Normal rate, regular rhythm, S1 normal, S2 normal, intact distal pulses and normal pulses. Exam reveals no gallop.  No murmur heard.  Pulmonary/Chest: Breath sounds normal. He has no wheezes. He has no rhonchi. He has no rales.  Abdominal: Soft. Normal appearance and bowel sounds are normal. There is no hepatosplenomegaly. There is no tenderness. There is no rebound, no guarding and no CVA tenderness. No hernia.  Genitourinary:  Prostate exam per URO  Lymphadenopathy:  He has no cervical adenopathy.  Neurological: He is alert. He has normal strength and normal reflexes. No cranial nerve deficit or sensory deficit. Gait normal.  Skin: Skin is warm, dry and intact. No rash noted.  Psychiatric: He has a normal mood and affect. His speech is normal and behavior is normal. Judgment normal.  MSK: full ROM in rght shoulder, neg drop arm, full strength, no neck pain, neg spurling, slightly positive neer's  Assessment & Plan:   CPX: The  patient's preventative maintenance and recommended screening tests for an annual wellness exam were reviewed in full today.  Brought up to date unless services declined.  Counselled on the importance of  diet, exercise, and its role in overall health and mortality.  The patient's FH and SH was reviewed, including their home life, tobacco status, and drug and alcohol status  .  Vaccines: Up to date with Td. Refuses flu vaccine.  Colon: polyp in 2009.Marland Kitchen Repeat recommended in 10 years.  Prostate: Lab Results  Component Value Date   PSA 1.25 01/01/2016   PSA 1.17 09/27/2011   PSA 0.83 10/09/2010  Stable from last year stable per Dr. Laverle Patter, prostate exam there this year.  Nonsmoker. Hep c; done  HIV: refused

## 2016-01-09 NOTE — Progress Notes (Signed)
Pre visit review using our clinic review tool, if applicable. No additional management support is needed unless otherwise documented below in the visit note. 

## 2016-01-09 NOTE — Addendum Note (Signed)
Addended by: Damita LackLORING, DONNA S on: 01/09/2016 03:33 PM   Modules accepted: Orders

## 2016-01-09 NOTE — Patient Instructions (Addendum)
We will call with X-ray results.  Can use ibuprofen 800 mg every 8 hour for pain as needed.

## 2016-01-10 ENCOUNTER — Other Ambulatory Visit: Payer: Self-pay

## 2016-11-11 ENCOUNTER — Telehealth: Payer: Self-pay

## 2016-11-11 NOTE — Telephone Encounter (Signed)
Pt left v/m; pt going on a cruise in few weeks and request sea sick patches; pt will be on cruise for 1 week. Last annual 01/09/16.Please advise. [facility] [address]

## 2016-11-12 MED ORDER — SCOPOLAMINE 1 MG/3DAYS TD PT72: 1.0000 | MEDICATED_PATCH | TRANSDERMAL | 0 refills | Status: DC

## 2016-11-12 NOTE — Telephone Encounter (Signed)
Mrs. Roberto [name] notified that Scopolamine patches have been sent into their pharmacy as requested.

## 2016-11-12 NOTE — Telephone Encounter (Signed)
Sent in rx as requested.

## 2017-01-09 ENCOUNTER — Encounter (INDEPENDENT_AMBULATORY_CARE_PROVIDER_SITE_OTHER): Payer: Self-pay

## 2017-01-09 ENCOUNTER — Telehealth: Payer: Self-pay | Admitting: Family Medicine

## 2017-01-09 ENCOUNTER — Other Ambulatory Visit (INDEPENDENT_AMBULATORY_CARE_PROVIDER_SITE_OTHER): Payer: BLUE CROSS/BLUE SHIELD

## 2017-01-09 DIAGNOSIS — Z125 Encounter for screening for malignant neoplasm of prostate: Secondary | ICD-10-CM

## 2017-01-09 DIAGNOSIS — Z13220 Encounter for screening for lipoid disorders: Secondary | ICD-10-CM

## 2017-01-09 LAB — COMPREHENSIVE METABOLIC PANEL
ALBUMIN: 4.1 g/dL (ref 3.5–5.2)
ALK PHOS: 59 U/L (ref 39–117)
ALT: 23 U/L (ref 0–53)
AST: 22 U/L (ref 0–37)
BUN: 16 mg/dL (ref 6–23)
CHLORIDE: 104 meq/L (ref 96–112)
CO2: 31 mEq/L (ref 19–32)
Calcium: 9.3 mg/dL (ref 8.4–10.5)
Creatinine, Ser: 0.92 mg/dL (ref 0.40–1.50)
GFR: 89.31 mL/min (ref >60.00)
Glucose, Bld: 95 mg/dL (ref 70–99)
POTASSIUM: 4.1 meq/L (ref 3.5–5.1)
SODIUM: 138 meq/L (ref 135–145)
TOTAL PROTEIN: 6.8 g/dL (ref 6.0–8.3)
Total Bilirubin: 0.9 mg/dL (ref 0.2–1.2)

## 2017-01-09 LAB — LIPID PANEL
CHOLESTEROL: 151 mg/dL (ref 0–200)
HDL: 47.9 mg/dL (ref >39.00)
LDL CALC: 91 mg/dL (ref 0–99)
NonHDL: 103.5
Total CHOL/HDL Ratio: 3
Triglycerides: 62 mg/dL (ref 0.0–149.0)
VLDL: 12.4 mg/dL (ref 0.0–40.0)

## 2017-01-09 NOTE — Telephone Encounter (Signed)
-----   Message from Baldomero Lamy sent at 12/30/2016 10:32 AM EDT ----- Regarding: Cpx labs Thurs 4/5, need orders. Thanks! :-) Please order  future cpx labs for pt's upcoming lab appt. Thanks Rodney Booze

## 2017-01-14 ENCOUNTER — Ambulatory Visit (INDEPENDENT_AMBULATORY_CARE_PROVIDER_SITE_OTHER): Payer: BLUE CROSS/BLUE SHIELD | Admitting: Family Medicine

## 2017-01-14 ENCOUNTER — Encounter: Payer: Self-pay | Admitting: Family Medicine

## 2017-01-14 VITALS — BP 130/80 | HR 79 | Temp 98.3°F | Ht 68.0 in | Wt 172.0 lb

## 2017-01-14 DIAGNOSIS — Z0000 Encounter for general adult medical examination without abnormal findings: Secondary | ICD-10-CM | POA: Diagnosis not present

## 2017-01-14 NOTE — Progress Notes (Signed)
Pre visit review using our clinic review tool, if applicable. No additional management support is needed unless otherwise documented below in the visit note. 

## 2017-01-14 NOTE — Patient Instructions (Addendum)
Work on healthy eating, decrease sweets.  Increase activity or exercise.   Consider starting baby aspirin daily to decrease risk.

## 2017-01-14 NOTE — Progress Notes (Signed)
Subjective:    Patient ID: Roberto [name], male    DOB: [birth year], 61 y.o.   MRN: [id number]  HPI The patient is here for annual wellness exam and preventative care.    BP Readings from Last 3 Encounters:  01/14/17 130/80  01/09/16 120/72  12/05/14 114/72  Body mass index is 26.15 kg/m. Wt Readings from Last 3 Encounters:  01/14/17 172 lb (78 kg)  01/09/16 167 lb 12 oz (76.1 kg)  12/05/14 170 lb 12 oz (77.5 kg)    Exercise: Staying active, walks a lot at work.  Diet: eating a lot of sweets. Labs reviewed in detail  Lab Results  Component Value Date   CHOL 151 01/09/2017   HDL 47.90 01/09/2017   LDLCALC 91 01/09/2017   TRIG 62.0 01/09/2017   CHOLHDL 3 01/09/2017     Social History /Family History/Past Medical History reviewed and updated if needed. Blood pressure 130/80, pulse 79, temperature 98.3 F (36.8 C), temperature source Oral, height  (1.727 m), weight 172 lb (78 kg).  Review of Systems  Constitutional: Negative for fatigue and fever.  HENT: Negative for ear pain.   Eyes: Negative for pain.  Respiratory: Negative for cough and shortness of breath.   Cardiovascular: Negative for chest pain, palpitations and leg swelling.  Gastrointestinal: Negative for abdominal pain.  Genitourinary: Negative for dysuria.  Musculoskeletal: Negative for arthralgias.  Neurological: Negative for syncope, light-headedness and headaches.  Psychiatric/Behavioral: Negative for dysphoric mood.       Objective:   Physical Exam  Constitutional: He appears well-developed and well-nourished.  Non-toxic appearance. He does not appear ill. No distress.  HENT:  Head: Normocephalic and atraumatic.  Right Ear: Hearing, tympanic membrane, external ear and ear canal normal.  Left Ear: Hearing, tympanic membrane, external ear and ear canal normal.  Nose: Nose normal.  Mouth/Throat: Uvula is midline, oropharynx is clear and moist and mucous membranes are normal.  Eyes:  Conjunctivae, EOM and lids are normal. Pupils are equal, round, and reactive to light. Lids are everted and swept, no foreign bodies found.  Neck: Trachea normal, normal range of motion and phonation normal. Neck supple. Carotid bruit is not present. No thyroid mass and no thyromegaly present.  Cardiovascular: Normal rate, regular rhythm, S1 normal, S2 normal, intact distal pulses and normal pulses.  Exam reveals no gallop.   No murmur heard. Pulmonary/Chest: Breath sounds normal. He has no wheezes. He has no rhonchi. He has no rales.  Abdominal: Soft. Normal appearance and bowel sounds are normal. There is no hepatosplenomegaly. There is no tenderness. There is no rebound, no guarding and no CVA tenderness. No hernia.  Lymphadenopathy:    He has no cervical adenopathy.  Neurological: He is alert. He has normal strength and normal reflexes. No cranial nerve deficit or sensory deficit. Gait normal.  Skin: Skin is warm, dry and intact. No rash noted.  Firm nodule on posterior scalp.. Size of lima bean, likely sub cyst. No redness and no warmth.  Psychiatric: He has a normal mood and affect. His speech is normal and behavior is normal. Judgment normal.          Assessment & Plan:  The patient's preventative maintenance and recommended screening tests for an annual wellness exam were reviewed in full today. Brought up to date unless services declined.  Counselled on the importance of diet, exercise, and its role in overall health and mortality. The patient's FH and SH was reviewed, including their home life, tobacco status,  and drug and alcohol status.   Vaccines: Up to date with Td. Refuses flu vaccine.  Colon: polyp in 2009.Marland Kitchen Repeat recommended in 10 years.  Prostate: per [facility], Dr. Laverle Patter. Nonsmoker. Hep c: done  HIV: refused

## 2017-11-09 IMAGING — DX DG CERVICAL SPINE COMPLETE 4+V
6 series · 6 of 6 positions shown · non-contrast
Comparison: None; correlation MRI cervical spine 05/17/2008

CLINICAL DATA: RIGHT shoulder pain, RIGHT arm numbness and tingling
since August 2015

EXAM:
CERVICAL SPINE - COMPLETE 4+ VIEW

[c-spine lat]
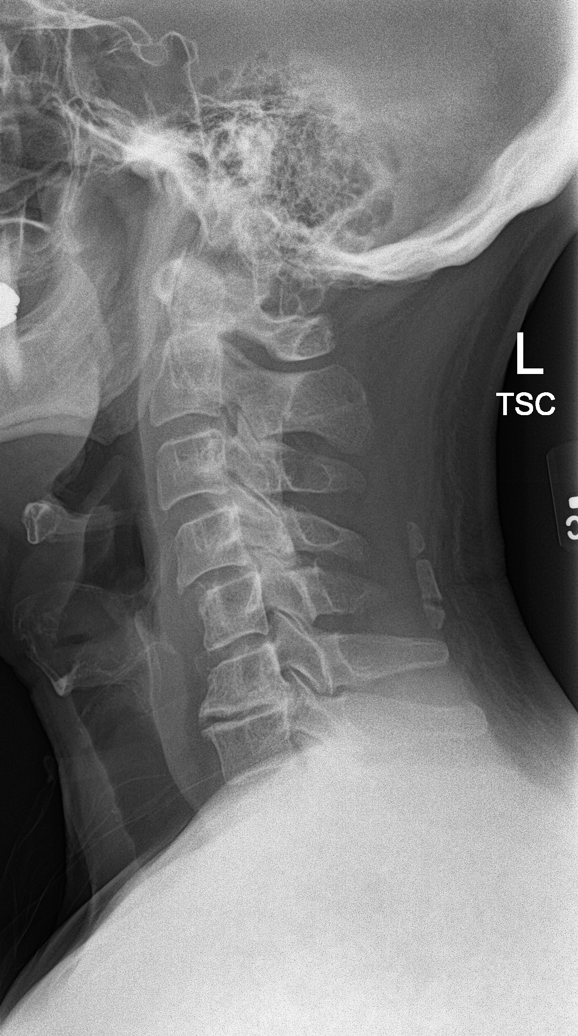

[c-spine obl (1 of 2)]
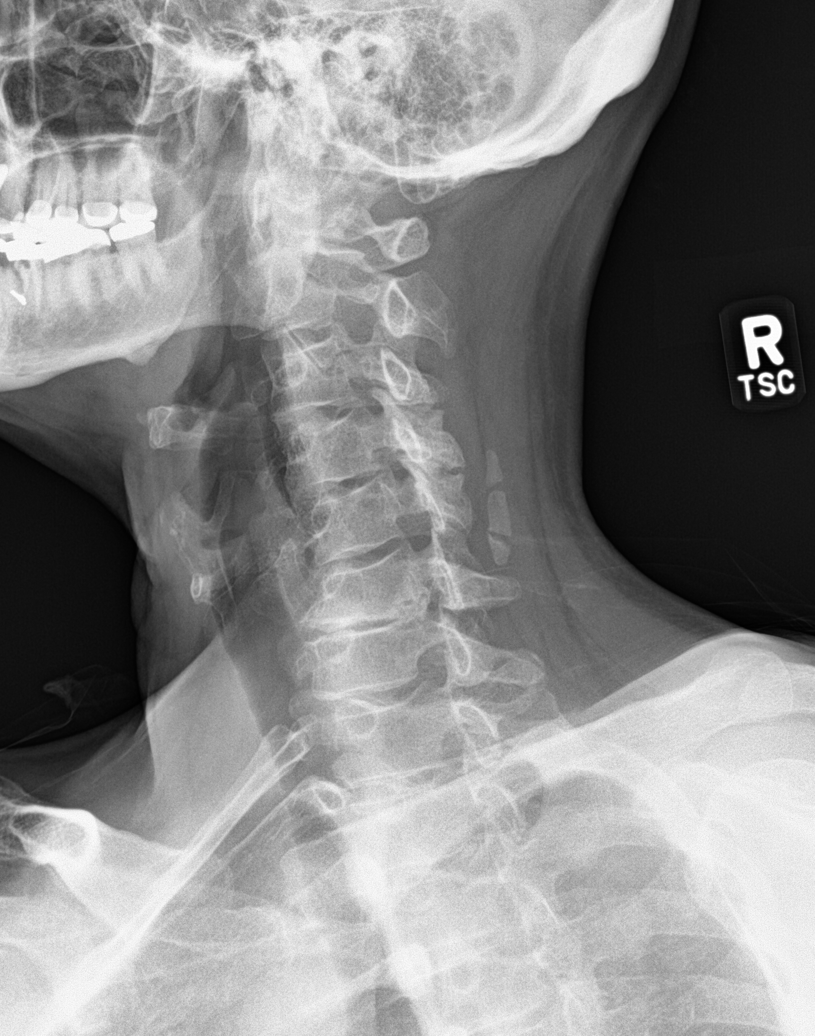

[c-spine obl (2 of 2)]
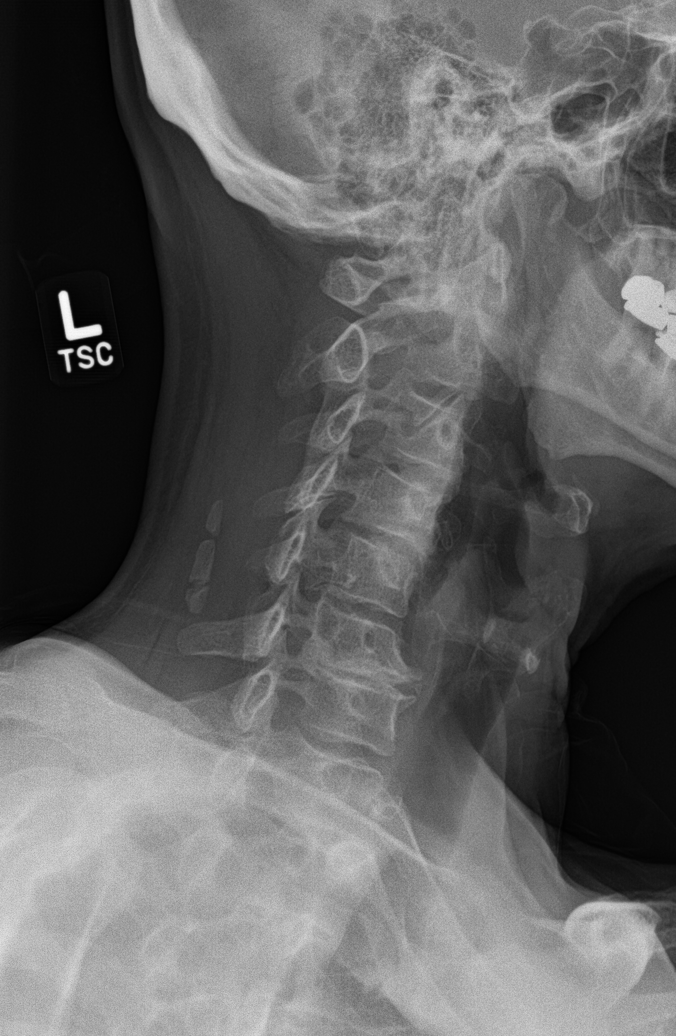

[c-spine ap]
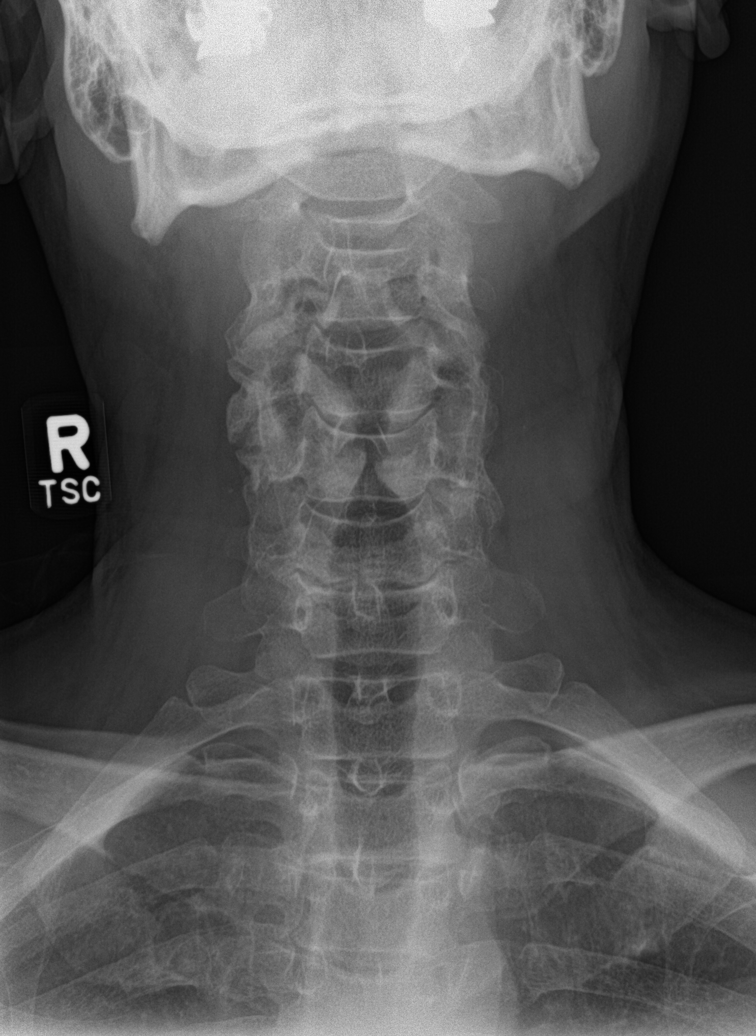

[c-spine open mouth]
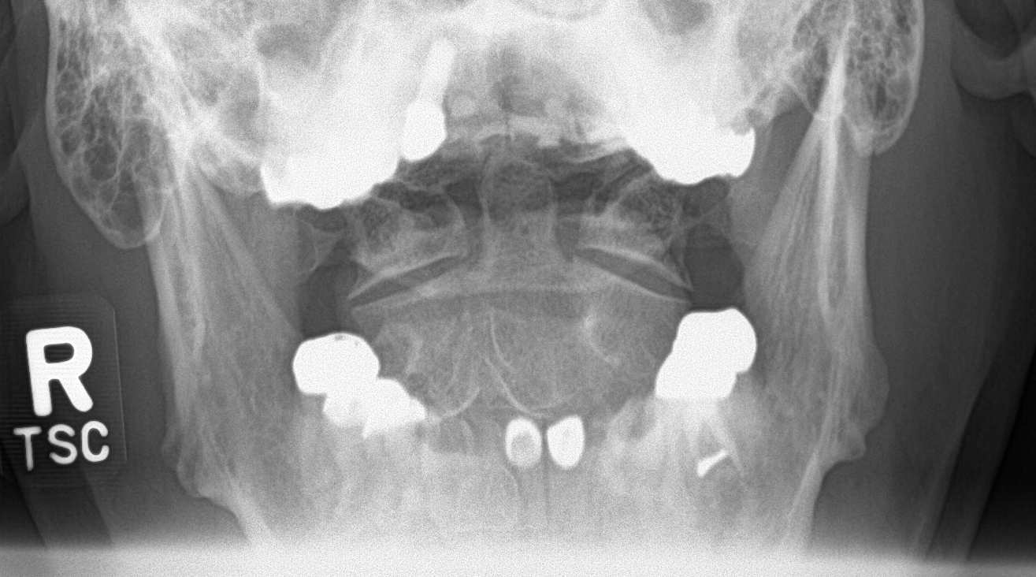

[c-spine swimmers]
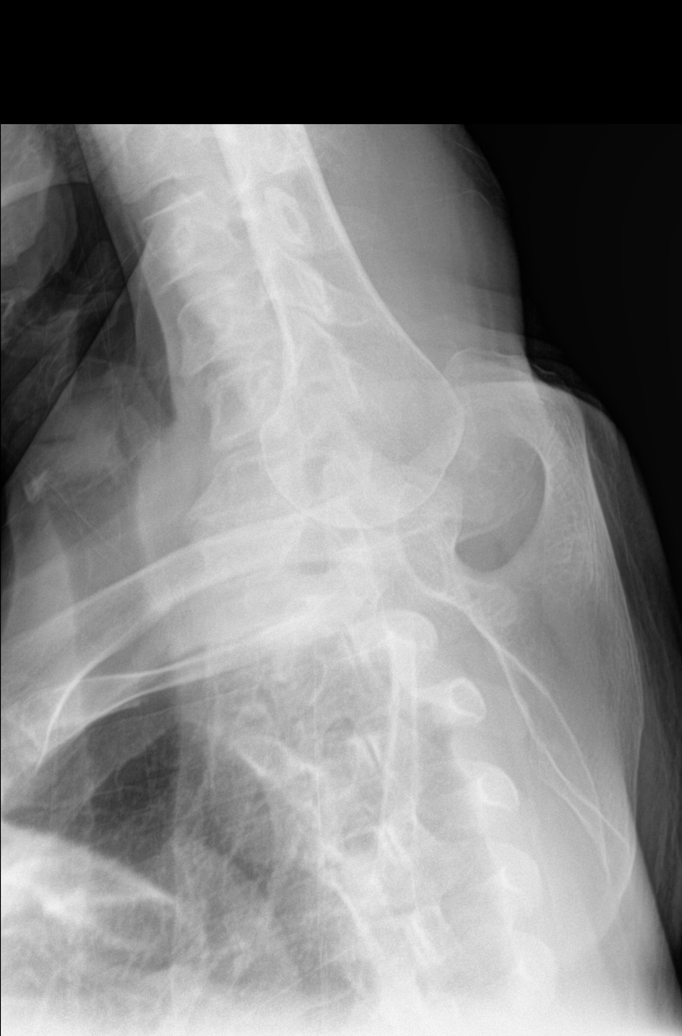

[6 of 6 positions shown; findings below may reference images not displayed]

FINDINGS: Prevertebral soft tissues normal thickness.

Osseous mineralization grossly normal for technique.

Disc space narrowing and endplate spur formation at C6-C7 and at
C4-C5, slightly increased.

Vertebral body heights maintained.

No acute fracture, subluxation or bone destruction.

Mild facet degenerative changes.

Uncovertebral hypertrophy encroaches upon BILATERAL C4-C5,
prominently at RIGHT C5-C6, and LEFT C6-C7.

Mild uncovertebral hypertrophy and facet hypertrophy at LEFT C3-C4.

C1-C2 alignment normal.

Lung apices clear.
IMPRESSION: Degenerative changes of the cervical spine as above.

If patient's symptoms are radicular in character, recommend MR
imaging cervical spine without contrast to assess for neural
compression particularly at RIGHT C5-C6.

## 2017-11-09 IMAGING — DX DG SHOULDER 2+V*R*
3 series · 3 of 3 positions shown · non-contrast
Comparison: None

CLINICAL DATA: RIGHT shoulder pain, RIGHT arm tingling, numbness
since August 2015

EXAM:
RIGHT SHOULDER - 2+ VIEW

[shoulder axial]
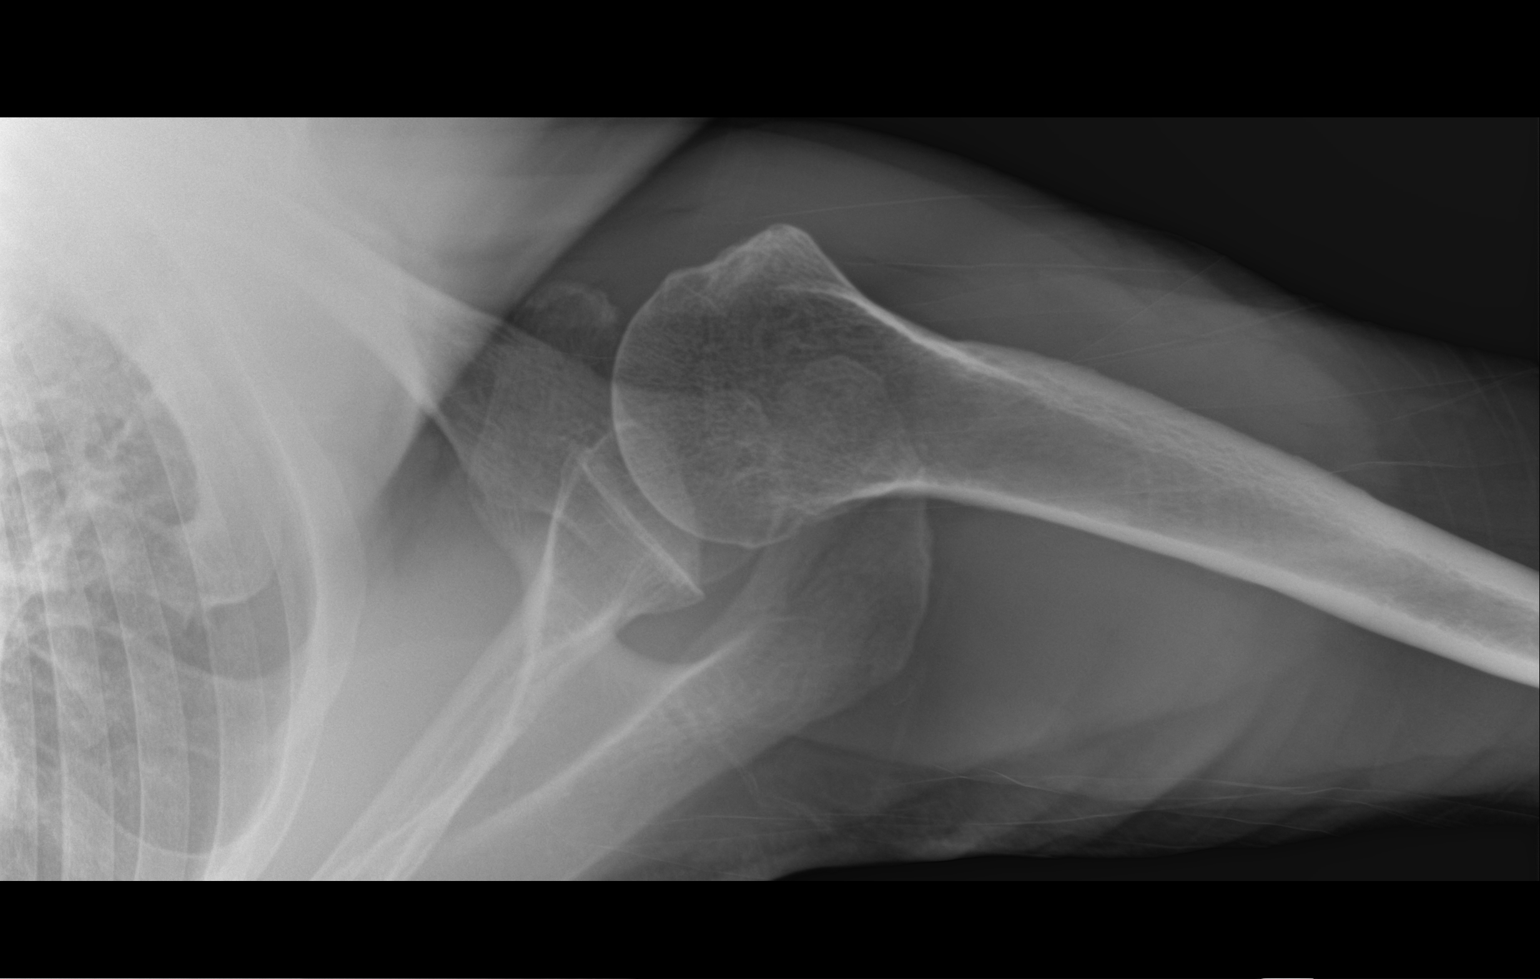

[shoulder y-view]
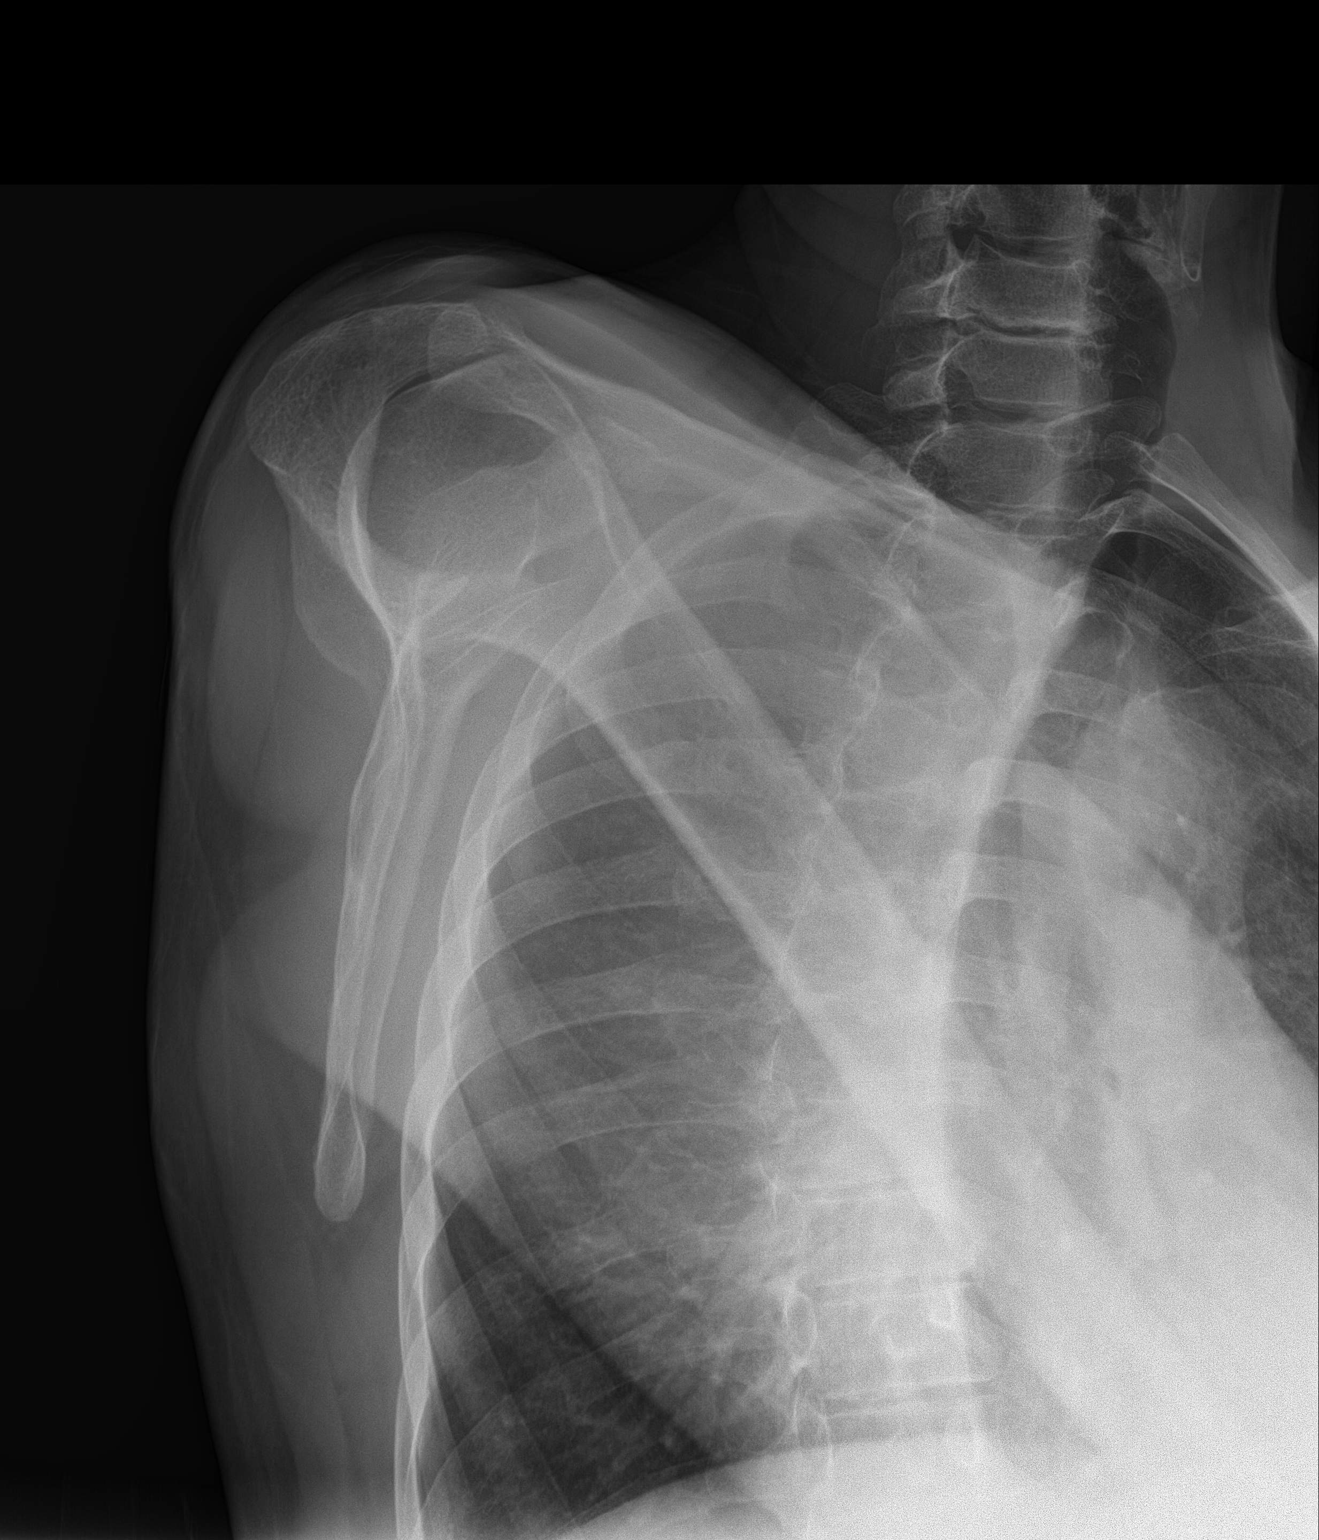

[shoulder ap]
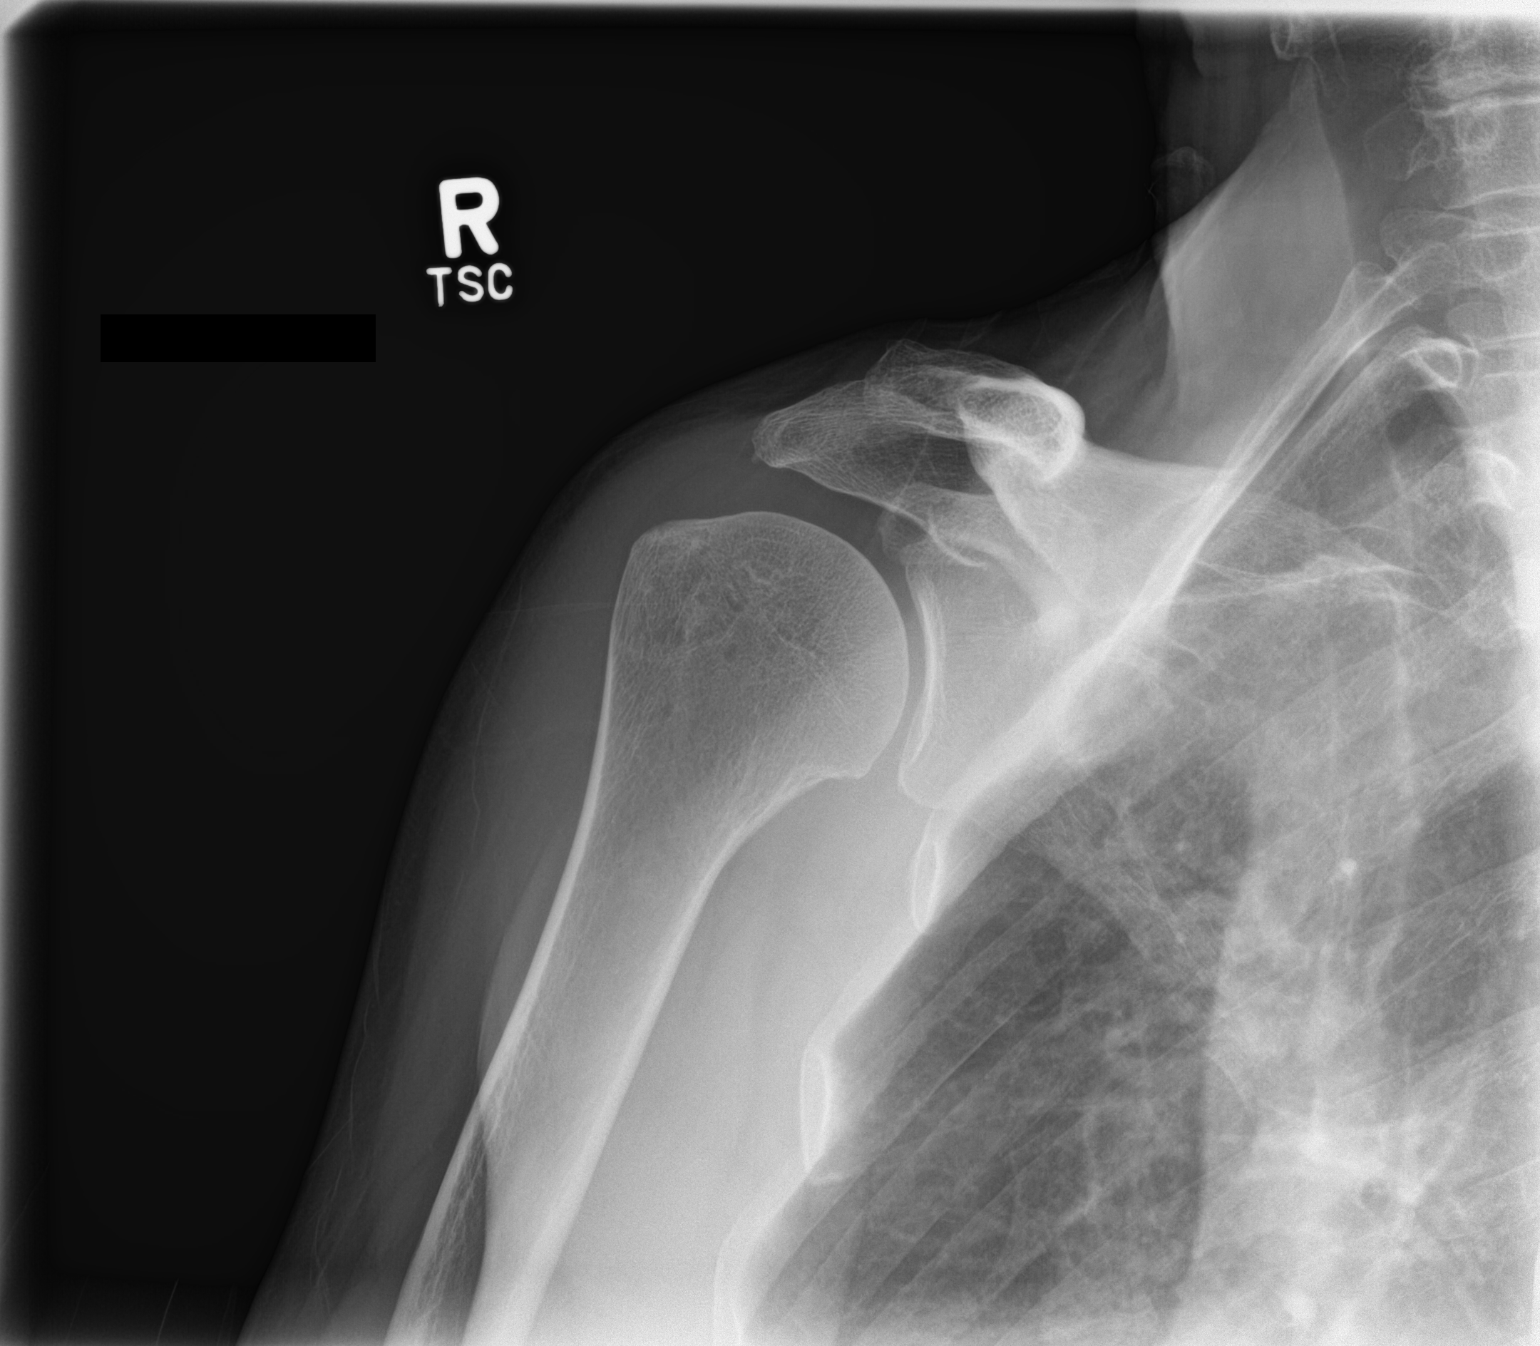

[3 of 3 positions shown; findings below may reference images not displayed]

FINDINGS: Osseous mineralization normal for technique.

AC joint alignment normal.

No acute fracture, dislocation or bone destruction.

Visualized RIGHT ribs normal appearance.
IMPRESSION: Normal exam.

## 2018-01-30 ENCOUNTER — Encounter: Payer: Self-pay | Admitting: Family Medicine

## 2018-01-30 ENCOUNTER — Other Ambulatory Visit: Payer: Self-pay

## 2018-01-30 ENCOUNTER — Ambulatory Visit: Payer: BLUE CROSS/BLUE SHIELD | Admitting: Family Medicine

## 2018-01-30 DIAGNOSIS — S76219A Strain of adductor muscle, fascia and tendon of unspecified thigh, initial encounter: Secondary | ICD-10-CM | POA: Insufficient documentation

## 2018-01-30 NOTE — Assessment & Plan Note (Signed)
No sign of hernia. Likely muscle strain in groin, now resolved.

## 2018-01-30 NOTE — Progress Notes (Signed)
   Subjective:    Patient ID: Roberto [name], male    DOB: October 15, 1955, 62 y.o.   MRN: [id number]  HPI  62 year old male  Presents for new onset pain in abdomen. Started 3 weeks ago after severe coughing spell with allergies.  Sudden sharp pain left groin. Lasted seconds.. Moderate ache lasting next 1 week.  Then pain resolved.   No associated symptoms.. Dysuria, no N/V/ D/C.  No fever.  No swelling in groin.   Hx of  right hernia repair  Age 32  left side repair age 62  Varicocele repair age 62   Review of Systems  Constitutional: Negative for fatigue and fever.  HENT: Negative for ear pain.   Eyes: Negative for pain.  Respiratory: Negative for cough and shortness of breath.   Cardiovascular: Negative for chest pain, palpitations and leg swelling.  Gastrointestinal: Negative for abdominal pain.  Genitourinary: Negative for dysuria.  Musculoskeletal: Negative for arthralgias.  Neurological: Negative for syncope, light-headedness and headaches.  Psychiatric/Behavioral: Negative for dysphoric mood.       Objective:   Physical Exam  Constitutional: Vital signs are normal. He appears well-developed and well-nourished.  HENT:  Head: Normocephalic.  Right Ear: Hearing normal.  Left Ear: Hearing normal.  Nose: Nose normal.  Mouth/Throat: Oropharynx is clear and moist and mucous membranes are normal.  Neck: Trachea normal. Carotid bruit is not present. No thyroid mass and no thyromegaly present.  Cardiovascular: Normal rate, regular rhythm and normal pulses. Exam reveals no gallop, no distant heart sounds and no friction rub.  No murmur heard. No peripheral edema  Pulmonary/Chest: Effort normal and breath sounds normal. No respiratory distress.  Abdominal: Soft. Bowel sounds are normal. There is no hepatosplenomegaly. There is no tenderness. There is no rebound and no CVA tenderness. No hernia. Hernia confirmed negative in the ventral area, confirmed negative in the right  inguinal area and confirmed negative in the left inguinal area.  Genitourinary: Testes normal. Right testis shows no mass, no swelling and no tenderness. Left testis shows no mass, no swelling and no tenderness.  Lymphadenopathy:       Right: No inguinal adenopathy present.       Left: No inguinal adenopathy present.  Skin: Skin is warm, dry and intact. No rash noted.  Psychiatric: He has a normal mood and affect. His speech is normal and behavior is normal. Thought content normal.          Assessment & Plan:

## 2018-03-20 ENCOUNTER — Other Ambulatory Visit: Payer: Self-pay

## 2018-03-23 ENCOUNTER — Other Ambulatory Visit: Payer: Self-pay

## 2018-03-27 ENCOUNTER — Encounter: Payer: Self-pay | Admitting: Family Medicine

## 2018-04-20 ENCOUNTER — Other Ambulatory Visit (INDEPENDENT_AMBULATORY_CARE_PROVIDER_SITE_OTHER): Payer: BLUE CROSS/BLUE SHIELD

## 2018-04-20 ENCOUNTER — Telehealth: Payer: Self-pay | Admitting: Family Medicine

## 2018-04-20 DIAGNOSIS — Z13220 Encounter for screening for lipoid disorders: Secondary | ICD-10-CM

## 2018-04-20 DIAGNOSIS — Z125 Encounter for screening for malignant neoplasm of prostate: Secondary | ICD-10-CM

## 2018-04-20 LAB — COMPREHENSIVE METABOLIC PANEL
ALBUMIN: 4 g/dL (ref 3.5–5.2)
ALK PHOS: 60 U/L (ref 39–117)
ALT: 18 U/L (ref 0–53)
AST: 17 U/L (ref 0–37)
BUN: 17 mg/dL (ref 6–23)
CHLORIDE: 105 meq/L (ref 96–112)
CO2: 28 mEq/L (ref 19–32)
CREATININE: 1.04 mg/dL (ref 0.40–1.50)
Calcium: 9.2 mg/dL (ref 8.4–10.5)
GFR: 77.19 mL/min (ref >60.00)
Glucose, Bld: 104 mg/dL — ABNORMAL HIGH (ref 70–99)
Potassium: 5 mEq/L (ref 3.5–5.1)
Sodium: 140 mEq/L (ref 135–145)
Total Bilirubin: 0.4 mg/dL (ref 0.2–1.2)
Total Protein: 6.6 g/dL (ref 6.0–8.3)

## 2018-04-20 LAB — LIPID PANEL
CHOLESTEROL: 144 mg/dL (ref 0–200)
HDL: 49.9 mg/dL (ref >39.00)
LDL CALC: 80 mg/dL (ref 0–99)
NonHDL: 94.49
Total CHOL/HDL Ratio: 3
Triglycerides: 71 mg/dL (ref 0.0–149.0)
VLDL: 14.2 mg/dL (ref 0.0–40.0)

## 2018-04-20 NOTE — Telephone Encounter (Signed)
-----   Message from Wendi MayaLauren Greeson, RT sent at 04/14/2018 11:06 AM EDT ----- Regarding: Lab orders for Monday 04/20/18 Please enter CPE lab orders for 04/20/18. Thanks-Lauren

## 2018-04-28 ENCOUNTER — Encounter: Payer: Self-pay | Admitting: Family Medicine

## 2018-04-28 ENCOUNTER — Ambulatory Visit (INDEPENDENT_AMBULATORY_CARE_PROVIDER_SITE_OTHER): Payer: BLUE CROSS/BLUE SHIELD | Admitting: Family Medicine

## 2018-04-28 VITALS — BP 124/78 | HR 73 | Temp 98.4°F | Ht 68.25 in | Wt 167.5 lb

## 2018-04-28 DIAGNOSIS — Z0000 Encounter for general adult medical examination without abnormal findings: Secondary | ICD-10-CM | POA: Diagnosis not present

## 2018-04-28 DIAGNOSIS — R7303 Prediabetes: Secondary | ICD-10-CM | POA: Insufficient documentation

## 2018-04-28 NOTE — Patient Instructions (Addendum)
Decrease carb in diet.  Increase exercise as able and increase veggie fats.  Expect a call from GI for colonoscopy after 07/28/2018.  Call for further eval with lab.. If fatigue is continuing.  If interested return for lab only visit to recheck glucose and A1C in 3 months after diet changes.

## 2018-04-28 NOTE — Progress Notes (Signed)
Subjective:    Patient ID: Roberto Ray Neoma Laming., male    DOB: [birth year], 62 y.o.   MRN: [id number]  HPI   The patient is here for annual wellness exam and preventative care.    He has felt somewhat tired in last few weeks. He has noted soreness behind knee... No swelling in legs. He has been fairly active. No family history of clotting issue.  Moderate sleep at night.. No different than normal.. No apnea.   Reviewed labs in detail.  Lab Results  Component Value Date   CHOL 144 04/20/2018   HDL 49.90 04/20/2018   LDLCALC 80 04/20/2018   TRIG 71.0 04/20/2018   CHOLHDL 3 04/20/2018    Diet: healthy Exercise: walking at work a lot, loading trailers  Has been drinking a lot of gatorade.  Social History /Family History/Past Medical History reviewed in detail and updated in EMR if needed. Blood pressure 124/78, pulse 73, temperature 98.4 F (36.9 C), temperature source Oral, height 5' 8.25" (1.734 m), weight 167 lb 8 oz (76 kg), SpO2 97 %.  Review of Systems  Constitutional: Negative for fatigue and fever.  HENT: Negative for ear pain.   Eyes: Negative for pain.  Respiratory: Negative for cough and shortness of breath.   Cardiovascular: Negative for chest pain, palpitations and leg swelling.  Gastrointestinal: Negative for abdominal pain.       Small amount of blood in stool few weeks ago... One time.  Genitourinary: Negative for dysuria.  Musculoskeletal: Negative for arthralgias.  Neurological: Negative for syncope, light-headedness and headaches.  Psychiatric/Behavioral: Negative for dysphoric mood.       Objective:   Physical Exam  Constitutional: He appears well-developed and well-nourished.  Non-toxic appearance. He does not appear ill. No distress.  HENT:  Head: Normocephalic and atraumatic.  Right Ear: Hearing, tympanic membrane, external ear and ear canal normal.  Left Ear: Hearing, tympanic membrane, external ear and ear canal normal.  Nose: Nose normal.    Mouth/Throat: Uvula is midline, oropharynx is clear and moist and mucous membranes are normal.  Eyes: Pupils are equal, round, and reactive to light. Conjunctivae, EOM and lids are normal. Lids are everted and swept, no foreign bodies found.  Neck: Trachea normal, normal range of motion and phonation normal. Neck supple. Carotid bruit is not present. No thyroid mass and no thyromegaly present.  Cardiovascular: Normal rate, regular rhythm, S1 normal, S2 normal, intact distal pulses and normal pulses. Exam reveals no gallop.  No murmur heard. Pulmonary/Chest: Breath sounds normal. He has no wheezes. He has no rhonchi. He has no rales.  Abdominal: Soft. Normal appearance and bowel sounds are normal. There is no hepatosplenomegaly. There is no tenderness. There is no rebound, no guarding and no CVA tenderness. No hernia.  Lymphadenopathy:    He has no cervical adenopathy.  Neurological: He is alert. He has normal strength and normal reflexes. No cranial nerve deficit or sensory deficit. Gait normal.  Skin: Skin is warm, dry and intact. No rash noted.  Psychiatric: He has a normal mood and affect. His speech is normal and behavior is normal. Judgment normal.          Assessment & Plan:  The patient's preventative maintenance and recommended screening tests for an annual wellness exam were reviewed in full today. Brought up to date unless services declined.  Counselled on the importance of diet, exercise, and its role in overall health and mortality. The patient's FH and SH was reviewed, including their home  life, tobacco status, and drug and alcohol status.    Vaccines: Up to date with Td. Refuses flu vaccine.  Colon: polyp in 2009.Marland Kitchen. Repeat recommended in 10 years. This year in October Prostate: per [facility], Dr. Laverle PatterBorden. Reviewed 03/27/18 OV. Nonsmoker. Hep c: done HIV: refused

## 2018-04-28 NOTE — Assessment & Plan Note (Signed)
Low carb diet. Stop gatorade. Encouraged exercise, weight loss, healthy eating habits.

## 2018-07-23 ENCOUNTER — Encounter (INDEPENDENT_AMBULATORY_CARE_PROVIDER_SITE_OTHER): Payer: Self-pay

## 2018-07-23 ENCOUNTER — Ambulatory Visit: Payer: BLUE CROSS/BLUE SHIELD | Admitting: Family Medicine

## 2018-07-23 ENCOUNTER — Encounter: Payer: Self-pay | Admitting: Family Medicine

## 2018-07-23 DIAGNOSIS — M21372 Foot drop, left foot: Secondary | ICD-10-CM | POA: Diagnosis not present

## 2018-07-23 DIAGNOSIS — M5416 Radiculopathy, lumbar region: Secondary | ICD-10-CM | POA: Insufficient documentation

## 2018-07-23 MED ORDER — PREDNISONE 20 MG PO TABS: ORAL_TABLET | ORAL | 0 refills | Status: DC

## 2018-07-23 NOTE — Progress Notes (Signed)
   Subjective:    Patient ID: Roberto Ray Neoma Laming., male    DOB: 03/12/1956, 62 y.o.   MRN: [id number]  HPI  62 year old male presents with low back pain new onset in last week.  had been in car dricing a lot.  He awoke with low back central pain, felt stiff  1 week ago.  No known fall, no known injury. Uncomfortable to sleep that night when lying on left side.  6 days ago noted left leg weakness, felt like left foot drop. 5 days ago new onset radiation of pain into left leg, shooting, burning pain.  Talked to daughter PT friend.. Started ibuprofen 800 mg every 8 hours.  helped some with pain. PT recommended OV. He has been doing low back stretches.  Has started improving some.   No fever, no numbnss in groin, no incontinence.  Blood pressure 120/64, pulse 71, temperature 98.4 F (36.9 C), temperature source Oral, height 5' 8.25" (1.734 m), weight 173 lb 4 oz (78.6 kg). Social History /Family History/Past Medical History reviewed in detail and updated in EMR if needed.    No past back surgery or injury. Does have off and on pain in low back in past.. Self limited. Review of Systems  Constitutional: Negative for fatigue and fever.  HENT: Negative for ear pain.   Eyes: Negative for pain.  Respiratory: Negative for cough and shortness of breath.   Cardiovascular: Negative for chest pain, palpitations and leg swelling.  Gastrointestinal: Negative for abdominal pain.  Genitourinary: Negative for dysuria.  Musculoskeletal: Positive for back pain. Negative for arthralgias.  Neurological: Negative for syncope, light-headedness and headaches.  Psychiatric/Behavioral: Negative for dysphoric mood.       Objective:   Physical Exam  Constitutional: Vital signs are normal. He appears well-developed and well-nourished.  HENT:  Head: Normocephalic.  Right Ear: Hearing normal.  Left Ear: Hearing normal.  Nose: Nose normal.  Mouth/Throat: Oropharynx is clear and moist and mucous  membranes are normal.  Neck: Trachea normal. Carotid bruit is not present. No thyroid mass and no thyromegaly present.  Cardiovascular: Normal rate, regular rhythm and normal pulses. Exam reveals no gallop, no distant heart sounds and no friction rub.  No murmur heard. No peripheral edema  Pulmonary/Chest: Effort normal and breath sounds normal. No respiratory distress.  Musculoskeletal:       Lumbar back: He exhibits decreased range of motion and tenderness. He exhibits no bony tenderness.   Positive SLR in left, neg faber's    Neurological: He exhibits abnormal muscle tone.   Decreased strength 3/5 left ankle flexion...   Skin: Skin is warm, dry and intact. No rash noted.  Psychiatric: He has a normal mood and affect. His speech is normal and behavior is normal. Thought content normal.          Assessment & Plan:

## 2018-07-23 NOTE — Assessment & Plan Note (Signed)
Start  Course of prednisone x 6 days. Hold ibuprofen till complete.  Continue heat ,  Continue PT and recommended home stretches.   Follow up in 2 weeks.

## 2018-07-23 NOTE — Patient Instructions (Signed)
Complete prednisone taper.  Start heat, continue physical therapy.  No heavy lifting > 10 lbs, repetitive twisting x 1 week.  Call if pain not improving and left foot drop not resolved in 1-2 weeks.

## 2018-07-30 ENCOUNTER — Telehealth: Payer: Self-pay | Admitting: *Deleted

## 2018-07-30 DIAGNOSIS — M21372 Foot drop, left foot: Secondary | ICD-10-CM

## 2018-07-30 DIAGNOSIS — M5416 Radiculopathy, lumbar region: Secondary | ICD-10-CM

## 2018-07-30 NOTE — Telephone Encounter (Signed)
Copied from CRM [id number]. Topic: Referral - Request for Referral >> Jul 30, 2018  9:55 AM Mickel Baas B, NT wrote: Has patient seen PCP for this complaint? Yes.   *If NO, is insurance requiring patient see PCP for this issue before PCP can refer them? Referral for which specialty:  [phone] Reason for referral: Patient states that he would like a referral to Dr Newell Coral regarding his drop foot.

## 2018-08-03 ENCOUNTER — Telehealth: Payer: Self-pay | Admitting: Family Medicine

## 2018-08-03 DIAGNOSIS — M21372 Foot drop, left foot: Secondary | ICD-10-CM

## 2018-08-03 DIAGNOSIS — M5416 Radiculopathy, lumbar region: Secondary | ICD-10-CM

## 2018-08-03 NOTE — Telephone Encounter (Signed)
Called patient to let him know the MRI was ordered. Called [facility] and faxed MRI order to them so they could call and set up the patient for MRI.

## 2018-08-03 NOTE — Telephone Encounter (Signed)
Received a call from the patient. Patient was scheduled to see Dr Newell Coral Neurosurgeon tomorrow 08/04/18. Patient got rescheduled for this appointment b/c the Dr's mother died. He is now rescheduled to see him next Tuesday, 08/11/18 for office visit and xrays. Patient is trying to get things expedited because he is out of work right now. He was wondering if you could order the MRI on him so when he went to see the Neurosurgeon he would have the MRI with him. The patients insurance doesn't require a Prior Authorization but he has to have it done at [facility] and the person scheduling it has to call [facility] so they can schedule the MRI and then it is covered 100%. Please advise if you will order the MRI.

## 2018-08-03 NOTE — Telephone Encounter (Signed)
Ordered MRI as requested

## 2018-08-05 ENCOUNTER — Encounter: Payer: Self-pay | Admitting: Family Medicine

## 2018-08-14 ENCOUNTER — Other Ambulatory Visit: Payer: Self-pay

## 2018-08-14 ENCOUNTER — Emergency Department (HOSPITAL_COMMUNITY)
Admission: EM | Admit: 2018-08-14 | Discharge: 2018-08-14 | Disposition: A | Discharge status: Home / Self Care | Payer: BLUE CROSS/BLUE SHIELD | Attending: Emergency Medicine | Admitting: Emergency Medicine

## 2018-08-14 ENCOUNTER — Encounter (HOSPITAL_COMMUNITY): Payer: Self-pay

## 2018-08-14 DIAGNOSIS — Z79899 Other long term (current) drug therapy: Secondary | ICD-10-CM | POA: Insufficient documentation

## 2018-08-14 DIAGNOSIS — Z9889 Other specified postprocedural states: Secondary | ICD-10-CM | POA: Insufficient documentation

## 2018-08-14 DIAGNOSIS — R339 Retention of urine, unspecified: Secondary | ICD-10-CM | POA: Diagnosis present

## 2018-08-14 MED ORDER — LIDOCAINE HCL URETHRAL/MUCOSAL 2 % EX GEL
1.0000 "application " | Freq: Once | CUTANEOUS | Status: AC | PRN
  Administered 2018-08-14: 1 via URETHRAL
  Filled 2018-08-14: qty 5

## 2018-08-14 NOTE — ED Notes (Signed)
Pt ambulated to restroom, he urinated 280, but still had post urination. Insert foley per EDP. Attempted foley, unable to advance past prostate. Okay for coude, requested Terri RN to place

## 2018-08-14 NOTE — ED Notes (Signed)
Bladder scan completed, reading 

## 2018-08-14 NOTE — ED Notes (Signed)
 drained from urinary bag. Bag changed to leg bag, instructions given to care for bag.

## 2018-08-14 NOTE — ED Triage Notes (Signed)
PT HAD A LOWER BACK PROCEDURE AT THE SURGERY CENTER TODAY, IN WHICH HE RECEIVED A FOLEY CATHETER. POST PROCEDURE, THE CATHETER WAS REMOVED, BUT THE PT WAS UNABLE TO EMPTY COMPLETELY. BLADDER SCAN REVEALED , AFTER VOIDING PRIOR TO LEAVING. THE STAFF ATTEMPTED TO REINSERT THE CATHETER W/O SUCCESS. BLADDER SCAN IN TRIAGE AT . PT DENIES ANY PAIN OR PRESSURE AT THIS TIME.
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
## 2018-11-23 LAB — HM COLONOSCOPY

## 2019-06-18 ENCOUNTER — Other Ambulatory Visit: Payer: BLUE CROSS/BLUE SHIELD

## 2019-06-24 ENCOUNTER — Encounter: Payer: BLUE CROSS/BLUE SHIELD | Admitting: Family Medicine

## 2019-06-29 ENCOUNTER — Telehealth: Payer: Self-pay | Admitting: Family Medicine

## 2019-06-29 ENCOUNTER — Other Ambulatory Visit (INDEPENDENT_AMBULATORY_CARE_PROVIDER_SITE_OTHER): Payer: BC Managed Care – PPO

## 2019-06-29 DIAGNOSIS — Z125 Encounter for screening for malignant neoplasm of prostate: Secondary | ICD-10-CM

## 2019-06-29 DIAGNOSIS — R7303 Prediabetes: Secondary | ICD-10-CM

## 2019-06-29 LAB — COMPREHENSIVE METABOLIC PANEL
ALT: 18 U/L (ref 0–53)
AST: 18 U/L (ref 0–37)
Albumin: 4.1 g/dL (ref 3.5–5.2)
Alkaline Phosphatase: 59 U/L (ref 39–117)
BUN: 16 mg/dL (ref 6–23)
CO2: 32 mEq/L (ref 19–32)
Calcium: 9.4 mg/dL (ref 8.4–10.5)
Chloride: 103 mEq/L (ref 96–112)
Creatinine, Ser: 0.87 mg/dL (ref 0.40–1.50)
GFR: 88.89 mL/min (ref >60.00)
Glucose, Bld: 88 mg/dL (ref 70–99)
Potassium: 4.6 mEq/L (ref 3.5–5.1)
Sodium: 140 mEq/L (ref 135–145)
Total Bilirubin: 0.6 mg/dL (ref 0.2–1.2)
Total Protein: 6.5 g/dL (ref 6.0–8.3)

## 2019-06-29 LAB — LIPID PANEL
Cholesterol: 152 mg/dL (ref 0–200)
HDL: 47.6 mg/dL (ref >39.00)
LDL Cholesterol: 94 mg/dL (ref 0–99)
NonHDL: 104.53
Total CHOL/HDL Ratio: 3
Triglycerides: 52 mg/dL (ref 0.0–149.0)
VLDL: 10.4 mg/dL (ref 0.0–40.0)

## 2019-06-29 LAB — PSA: PSA: 2.27 ng/mL (ref 0.10–4.00)

## 2019-06-29 LAB — HEMOGLOBIN A1C: Hgb A1c MFr Bld: 5.5 % (ref 4.6–6.5)

## 2019-06-29 NOTE — Progress Notes (Signed)
No critical labs need to be addressed urgently. We will discuss labs in detail at upcoming office visit.   

## 2019-06-29 NOTE — Telephone Encounter (Signed)
-----   Message from Ellamae Sia sent at 06/22/2019 11:17 AM EDT ----- Regarding: Lab orders for Tuesday, 9.22.20 Patient is scheduled for CPX labs, please order future labs, Thanks , Karna Christmas

## 2019-07-06 ENCOUNTER — Other Ambulatory Visit: Payer: Self-pay

## 2019-07-06 ENCOUNTER — Encounter: Payer: Self-pay | Admitting: Family Medicine

## 2019-07-06 ENCOUNTER — Ambulatory Visit (INDEPENDENT_AMBULATORY_CARE_PROVIDER_SITE_OTHER): Payer: BC Managed Care – PPO | Admitting: Family Medicine

## 2019-07-06 VITALS — BP 100/62 | HR 72 | Temp 98.2°F | Ht 67.75 in | Wt 165.8 lb

## 2019-07-06 DIAGNOSIS — Z0000 Encounter for general adult medical examination without abnormal findings: Secondary | ICD-10-CM

## 2019-07-06 DIAGNOSIS — R7303 Prediabetes: Secondary | ICD-10-CM

## 2019-07-06 DIAGNOSIS — Z23 Encounter for immunization: Secondary | ICD-10-CM

## 2019-07-06 NOTE — Patient Instructions (Signed)
Keep working on healthy eating and work on increasing exercise.

## 2019-07-06 NOTE — Assessment & Plan Note (Signed)
Resolved

## 2019-07-06 NOTE — Progress Notes (Signed)
Chief Complaint  Patient presents with  . Annual Exam    History of Present Illness: HPI  The patient is here for annual wellness exam and preventative care.   He is now retired. Pt doing well overall.  Had back surgery last year 08/2018.. Dr. Mauri Pole.   Left foot drop is improving.   Reviewed labs in detail Lab Results  Component Value Date   CHOL 152 06/29/2019   HDL 47.60 06/29/2019   LDLCALC 94 06/29/2019   TRIG 52.0 06/29/2019   CHOLHDL 3 06/29/2019   Diet: good Exercise: minimal.. working on increasing  walking   COVID 19 screen No recent travel or known exposure to COVID19 The patient denies respiratory symptoms of COVID 19 at this time.  The importance of social distancing was discussed today.   Review of Systems  Constitutional: Negative for chills and fever.  HENT: Negative for congestion and ear pain.   Eyes: Negative for pain and redness.  Respiratory: Negative for cough and shortness of breath.   Cardiovascular: Negative for chest pain, palpitations and leg swelling.  Gastrointestinal: Negative for abdominal pain, blood in stool, constipation, diarrhea, nausea and vomiting.  Genitourinary: Negative for dysuria.  Musculoskeletal: Negative for falls and myalgias.  Skin: Negative for rash.  Neurological: Negative for dizziness.  Psychiatric/Behavioral: Negative for depression. The patient is not nervous/anxious.       Past Medical History:  Diagnosis Date  . Arthritis     reports that he has never smoked. He has never used smokeless tobacco. He reports that he does not drink alcohol or use drugs.   Current Outpatient Medications:  .  acetaminophen (TYLENOL) 500 MG tablet, Take 500 mg by mouth every 6 (six) hours as needed for moderate pain., Disp: , Rfl:  .  Ascorbic Acid (VITAMIN C) 1000 MG tablet, Take 2,000 mg by mouth daily. , Disp: , Rfl:  .  chlorhexidine (PERIDEX) 0.12 % solution, Use as directed 5 mLs in the mouth or throat 2 (two) times  daily as needed (mouth rinse). Reported on 01/09/2016, Disp: , Rfl: 12 .  fish oil-omega-3 fatty acids 1000 MG capsule, Take 1 g by mouth daily. , Disp: , Rfl:  .  glucosamine-chondroitin 500-400 MG tablet, Take 3 tablets by mouth daily. , Disp: , Rfl:  .  Multiple Vitamins-Minerals (CENTRUM SILVER 50+MEN PO), Take 1 tablet by mouth daily., Disp: , Rfl:  .  TURMERIC PO, Take 1 tablet by mouth daily., Disp: , Rfl:    Observations/Objective: Blood pressure 100/62, pulse 72, temperature 98.2 F (36.8 C), temperature source Temporal, height 5' 7.75" (1.721 m), weight 165 lb 12 oz (75.2 kg), SpO2 97 %.  Physical Exam Constitutional:      General: He is not in acute distress.    Appearance: Normal appearance. He is well-developed. He is not ill-appearing or toxic-appearing.  HENT:     Head: Normocephalic and atraumatic.     Right Ear: Hearing, tympanic membrane, ear canal and external ear normal.     Left Ear: Hearing, tympanic membrane, ear canal and external ear normal.     Nose: Nose normal.     Mouth/Throat:     Pharynx: Uvula midline.  Eyes:     General: Lids are normal. Lids are everted, no foreign bodies appreciated.     Conjunctiva/sclera: Conjunctivae normal.     Pupils: Pupils are equal, round, and reactive to light.  Neck:     Musculoskeletal: Normal range of motion and neck supple.  Thyroid: No thyroid mass or thyromegaly.     Vascular: No carotid bruit.     Trachea: Trachea and phonation normal.  Cardiovascular:     Rate and Rhythm: Normal rate and regular rhythm.     Pulses: Normal pulses.     Heart sounds: S1 normal and S2 normal. No murmur. No gallop.   Pulmonary:     Breath sounds: Normal breath sounds. No wheezing, rhonchi or rales.  Abdominal:     General: Bowel sounds are normal.     Palpations: Abdomen is soft.     Tenderness: There is no abdominal tenderness. There is no guarding or rebound.     Hernia: No hernia is present.  Lymphadenopathy:     Cervical:  No cervical adenopathy.  Skin:    General: Skin is warm and dry.     Findings: No rash.  Neurological:     Mental Status: He is alert.     Cranial Nerves: No cranial nerve deficit.     Sensory: No sensory deficit.     Gait: Gait normal.     Deep Tendon Reflexes: Reflexes are normal and symmetric.  Psychiatric:        Speech: Speech normal.        Behavior: Behavior normal.        Judgment: Judgment normal.     Wt Readings from Last 3 Encounters:  07/06/19 165 lb 12 oz (75.2 kg)  08/14/18 170 lb (77.1 kg)  07/23/18 173 lb 4 oz (78.6 kg)   Body mass index is 25.39 kg/m.   Assessment and Plan   The patient's preventative maintenance and recommended screening tests for an annual wellness exam were reviewed in full today. Brought up to date unless services declined.  Counselled on the importance of diet, exercise, and its role in overall health and mortality. The patient's FH and SH was reviewed, including their home life, tobacco status, and drug and alcohol status.   Vaccines: given tdap today. Refuses flu vaccine.  Colon: polyp in 2009.. 11/2018 [facility] polyps repeat in 1 year  Prostate:per [facility], Dr. Alinda Money.   May have had prostatitis.. now resolved. Lab Results  Component Value Date   PSA 2.27 06/29/2019   PSA 1.25 01/01/2016   PSA 1.17 09/27/2011  Nonsmoker. Hep c:done HIV: refused    Eliezer Lofts, MD

## 2019-07-20 ENCOUNTER — Encounter: Payer: Self-pay | Admitting: Family Medicine

## 2020-02-07 LAB — HM COLONOSCOPY

## 2020-02-16 ENCOUNTER — Encounter: Payer: Self-pay | Admitting: Family Medicine

## 2020-06-30 ENCOUNTER — Other Ambulatory Visit: Payer: BC Managed Care – PPO

## 2020-07-07 ENCOUNTER — Encounter: Payer: BC Managed Care – PPO | Admitting: Family Medicine

## 2020-08-18 ENCOUNTER — Other Ambulatory Visit (INDEPENDENT_AMBULATORY_CARE_PROVIDER_SITE_OTHER): Payer: BC Managed Care – PPO

## 2020-08-18 ENCOUNTER — Telehealth: Payer: Self-pay | Admitting: Family Medicine

## 2020-08-18 ENCOUNTER — Other Ambulatory Visit: Payer: Self-pay

## 2020-08-18 DIAGNOSIS — R7303 Prediabetes: Secondary | ICD-10-CM

## 2020-08-18 DIAGNOSIS — Z125 Encounter for screening for malignant neoplasm of prostate: Secondary | ICD-10-CM | POA: Diagnosis not present

## 2020-08-18 LAB — COMPREHENSIVE METABOLIC PANEL
ALT: 19 U/L (ref 0–53)
AST: 18 U/L (ref 0–37)
Albumin: 4.2 g/dL (ref 3.5–5.2)
Alkaline Phosphatase: 89 U/L (ref 39–117)
BUN: 18 mg/dL (ref 6–23)
CO2: 30 mEq/L (ref 19–32)
Calcium: 9.3 mg/dL (ref 8.4–10.5)
Chloride: 102 mEq/L (ref 96–112)
Creatinine, Ser: 0.98 mg/dL (ref 0.40–1.50)
GFR: 82.23 mL/min (ref >60.00)
Glucose, Bld: 88 mg/dL (ref 70–99)
Potassium: 4.3 mEq/L (ref 3.5–5.1)
Sodium: 138 mEq/L (ref 135–145)
Total Bilirubin: 0.9 mg/dL (ref 0.2–1.2)
Total Protein: 7 g/dL (ref 6.0–8.3)

## 2020-08-18 LAB — PSA: PSA: 2.54 ng/mL (ref 0.10–4.00)

## 2020-08-18 LAB — HEMOGLOBIN A1C: Hgb A1c MFr Bld: 5.6 % (ref 4.6–6.5)

## 2020-08-18 LAB — LIPID PANEL
Cholesterol: 151 mg/dL (ref 0–200)
HDL: 43.5 mg/dL (ref >39.00)
LDL Cholesterol: 92 mg/dL (ref 0–99)
NonHDL: 107.63
Total CHOL/HDL Ratio: 3
Triglycerides: 79 mg/dL (ref 0.0–149.0)
VLDL: 15.8 mg/dL (ref 0.0–40.0)

## 2020-08-18 NOTE — Progress Notes (Signed)
No critical labs need to be addressed urgently. We will discuss labs in detail at upcoming office visit.   

## 2020-08-18 NOTE — Telephone Encounter (Signed)
-----   Message from Alvina Chou sent at 08/02/2020  2:32 PM EDT ----- Regarding: Lab orders for Friday, 11.12.21 Patient is scheduled for CPX labs, please order future labs, Thanks , Camelia Eng

## 2020-08-25 ENCOUNTER — Ambulatory Visit (INDEPENDENT_AMBULATORY_CARE_PROVIDER_SITE_OTHER): Payer: BC Managed Care – PPO | Admitting: Family Medicine

## 2020-08-25 ENCOUNTER — Other Ambulatory Visit: Payer: Self-pay

## 2020-08-25 ENCOUNTER — Encounter: Payer: Self-pay | Admitting: Family Medicine

## 2020-08-25 VITALS — BP 130/80 | HR 67 | Temp 98.3°F | Ht 68.0 in | Wt 184.2 lb

## 2020-08-25 DIAGNOSIS — Z0000 Encounter for general adult medical examination without abnormal findings: Secondary | ICD-10-CM | POA: Diagnosis not present

## 2020-08-25 DIAGNOSIS — R7303 Prediabetes: Secondary | ICD-10-CM | POA: Diagnosis not present

## 2020-08-25 NOTE — Patient Instructions (Addendum)
Work on regular exercise and healthy eating.  Work on weight loss.   Preventive Care 95-63 Years Old, Male Preventive care refers to lifestyle choices and visits with your health care provider that can promote health and wellness. This includes:  A yearly physical exam. This is also called an annual well check.  Regular dental and eye exams.  Immunizations.  Screening for certain conditions.  Healthy lifestyle choices, such as eating a healthy diet, getting regular exercise, not using drugs or products that contain nicotine and tobacco, and limiting alcohol use. What can I expect for my preventive care visit? Physical exam Your health care provider will check:  Height and weight. These may be used to calculate body mass index (BMI), which is a measurement that tells if you are at a healthy weight.  Heart rate and blood pressure.  Your skin for abnormal spots. Counseling Your health care provider may ask you questions about:  Alcohol, tobacco, and drug use.  Emotional well-being.  Home and relationship well-being.  Sexual activity.  Eating habits.  Work and work Statistician. What immunizations do I need?  Influenza (flu) vaccine  This is recommended every year. Tetanus, diphtheria, and pertussis (Tdap) vaccine  You may need a Td booster every 10 years. Varicella (chickenpox) vaccine  You may need this vaccine if you have not already been vaccinated. Zoster (shingles) vaccine  You may need this after age 63. Measles, mumps, and rubella (MMR) vaccine  You may need at least one dose of MMR if you were born in 1957 or later. You may also need a second dose. Pneumococcal conjugate (PCV13) vaccine  You may need this if you have certain conditions and were not previously vaccinated. Pneumococcal polysaccharide (PPSV23) vaccine  You may need one or two doses if you smoke cigarettes or if you have certain conditions. Meningococcal conjugate (MenACWY)  vaccine  You may need this if you have certain conditions. Hepatitis A vaccine  You may need this if you have certain conditions or if you travel or work in places where you may be exposed to hepatitis A. Hepatitis B vaccine  You may need this if you have certain conditions or if you travel or work in places where you may be exposed to hepatitis B. Haemophilus influenzae type b (Hib) vaccine  You may need this if you have certain risk factors. Human papillomavirus (HPV) vaccine  If recommended by your health care provider, you may need three doses over 6 months. You may receive vaccines as individual doses or as more than one vaccine together in one shot (combination vaccines). Talk with your health care provider about the risks and benefits of combination vaccines. What tests do I need? Blood tests  Lipid and cholesterol levels. These may be checked every 5 years, or more frequently if you are over 25 years old.  Hepatitis C test.  Hepatitis B test. Screening  Lung cancer screening. You may have this screening every year starting at age 63 if you have a 30-pack-year history of smoking and currently smoke or have quit within the past 15 years.  Prostate cancer screening. Recommendations will vary depending on your family history and other risks.  Colorectal cancer screening. All adults should have this screening starting at age 63 and continuing until age 74 and continuing until age 74. Your health care provider may recommend screening at age 63 if you are at increased risk. You will have tests every 1-10 years, depending on your results and the type of screening test.  Diabetes screening.  This is done by checking your blood sugar (glucose) after you have not eaten for a while (fasting). You may have this done every 1-3 years.  Sexually transmitted disease (STD) testing. Follow these instructions at home: Eating and drinking  Eat a diet that includes fresh fruits and vegetables, whole grains, lean protein,  and low-fat dairy products.  Take vitamin and mineral supplements as recommended by your health care provider.  Do not drink alcohol if your health care provider tells you not to drink.  If you drink alcohol: ? Limit how much you have to 0-2 drinks a day. ? Be aware of how much alcohol is in your drink. In the [place] one drink equals one 12 oz bottle of beer (355 mL), one 5 oz glass of wine (148 mL), or one 1 oz glass of hard liquor (44 mL). Lifestyle  Take daily care of your teeth and gums.  Stay active. Exercise for at least 30 minutes on 5 or more days each week.  Do not use any products that contain nicotine or tobacco, such as cigarettes, e-cigarettes, and chewing tobacco. If you need help quitting, ask your health care provider.  If you are sexually active, practice safe sex. Use a condom or other form of protection to prevent STIs (sexually transmitted infections).  Talk with your health care provider about taking a low-dose aspirin every day starting at age 63. What's next?  Go to your health care provider once a year for a well check visit.  Ask your health care provider how often you should have your eyes and teeth checked.  Stay up to date on all vaccines. This information is not intended to replace advice given to you by your health care provider. Make sure you discuss any questions you have with your health care provider. Document Revised: 09/17/2018 Document Reviewed: 09/17/2018 Elsevier Patient Education  2020 [employer].

## 2020-08-25 NOTE — Progress Notes (Signed)
Chief Complaint  Patient presents with  . Annual Exam    History of Present Illness: HPI   The patient is here for annual wellness exam and preventative care.    Doing well overall.  No new issues.  06/2020 Repaired Shoulder Rotator cuff 06/2020  reviewed note from Dr. Ave Filter.  Prediabetes  Lab Results  Component Value Date   HGBA1C 5.6 08/18/2020     Labs reviewed in detail with patient.   Lab Results  Component Value Date   CHOL 151 08/18/2020   HDL 43.50 08/18/2020   LDLCALC 92 08/18/2020   TRIG 79.0 08/18/2020   CHOLHDL 3 08/18/2020  The 10-year ASCVD risk score Denman George DC Jr., et al., 2013) is: 9.9%   Values used to calculate the score:     Age: 64 years     Sex: Male     Is Non-Hispanic African American: No     Diabetic: No     Tobacco smoker: No     Systolic Blood Pressure: 130 mmHg     Is BP treated: No     HDL Cholesterol: 43.5 mg/dL     Total Cholesterol: 151 mg/dL   Wt Readings from Last 3 Encounters:  08/25/20 184 lb 4 oz (83.6 kg)  07/06/19 165 lb 12 oz (75.2 kg)  08/14/18 170 lb (77.1 kg)  Body mass index is 28.02 kg/m.   Diet: moderate Exercise: walking several days a week. He is less active since retirement  Has noted buzzing in ears in last few months. No noted hearing loss. Last hearing test 10/2018  At work around equipment.  This visit occurred during the SARS-CoV-2 public health emergency.  Safety protocols were in place, including screening questions prior to the visit, additional usage of staff PPE, and extensive cleaning of exam room while observing appropriate contact time as indicated for disinfecting solutions.   COVID 19 screen:  No recent travel or known exposure to COVID19 The patient denies respiratory symptoms of COVID 19 at this time. The importance of social distancing was discussed today.     Review of Systems  Constitutional: Negative for chills and fever.  HENT: Negative for congestion and ear pain.   Eyes:  Negative for pain and redness.  Respiratory: Negative for cough and shortness of breath.   Cardiovascular: Negative for chest pain, palpitations and leg swelling.  Gastrointestinal: Negative for abdominal pain, blood in stool, constipation, diarrhea, nausea and vomiting.  Genitourinary: Negative for dysuria.  Musculoskeletal: Negative for falls and myalgias.  Skin: Negative for rash.  Neurological: Negative for dizziness.  Psychiatric/Behavioral: Negative for depression. The patient is not nervous/anxious.       Past Medical History:  Diagnosis Date  . Arthritis     reports that he has never smoked. He has never used smokeless tobacco. He reports that he does not drink alcohol and does not use drugs.   Current Outpatient Medications:  .  acetaminophen (TYLENOL) 500 MG tablet, Take 500 mg by mouth every 6 (six) hours as needed for moderate pain., Disp: , Rfl:  .  Ascorbic Acid (VITAMIN C) 1000 MG tablet, Take 2,000 mg by mouth daily. , Disp: , Rfl:  .  fish oil-omega-3 fatty acids 1000 MG capsule, Take 1 g by mouth daily. , Disp: , Rfl:  .  glucosamine-chondroitin 500-400 MG tablet, Take 3 tablets by mouth daily. , Disp: , Rfl:  .  Multiple Vitamins-Minerals (CENTRUM SILVER 50+MEN PO), Take 1 tablet by mouth daily., Disp: , Rfl:  .  TURMERIC PO, Take 1 tablet by mouth daily., Disp: , Rfl:    Observations/Objective: Blood pressure 130/80, pulse 67, temperature 98.3 F (36.8 C), temperature source Temporal, height 5\' 8"  (1.727 m), weight 184 lb 4 oz (83.6 kg), SpO2 97 %.  Physical Exam Constitutional:      General: He is not in acute distress.    Appearance: Normal appearance. He is well-developed. He is not ill-appearing or toxic-appearing.  HENT:     Head: Normocephalic and atraumatic.     Right Ear: Hearing, tympanic membrane, ear canal and external ear normal.     Left Ear: Hearing, tympanic membrane, ear canal and external ear normal.     Nose: Nose normal.     Mouth/Throat:      Pharynx: Uvula midline.  Eyes:     General: Lids are normal. Lids are everted, no foreign bodies appreciated.     Conjunctiva/sclera: Conjunctivae normal.     Pupils: Pupils are equal, round, and reactive to light.  Neck:     Thyroid: No thyroid mass or thyromegaly.     Vascular: No carotid bruit.     Trachea: Trachea and phonation normal.  Cardiovascular:     Rate and Rhythm: Normal rate and regular rhythm.     Pulses: Normal pulses.     Heart sounds: S1 normal and S2 normal. No murmur heard.  No gallop.   Pulmonary:     Breath sounds: Normal breath sounds. No wheezing, rhonchi or rales.  Abdominal:     General: Bowel sounds are normal.     Palpations: Abdomen is soft.     Tenderness: There is no abdominal tenderness. There is no guarding or rebound.     Hernia: No hernia is present.  Musculoskeletal:     Cervical back: Normal range of motion and neck supple.  Lymphadenopathy:     Cervical: No cervical adenopathy.  Skin:    General: Skin is warm and dry.     Findings: No rash.  Neurological:     Mental Status: He is alert.     Cranial Nerves: No cranial nerve deficit.     Sensory: No sensory deficit.     Gait: Gait normal.     Deep Tendon Reflexes: Reflexes are normal and symmetric.  Psychiatric:        Speech: Speech normal.        Behavior: Behavior normal.        Judgment: Judgment normal.      Assessment and Plan   The patient's preventative maintenance and recommended screening tests for an annual wellness exam were reviewed in full today. Brought up to date unless services declined.  Counselled on the importance of diet, exercise, and its role in overall health and mortality. The patient's FH and SH was reviewed, including their home life, tobacco status, and drug and alcohol status.   Vaccines: Uptodate TDap, Refuses flu vaccine.  Has gotten COVID vaccine  x 2  Questions answered. Colon: 02/07/2020, repeat in 2 years.  Prostate:per [facility], Dr. 04/08/2020.   May have had prostatitis.. now resolved. Lab Results  Component Value Date   PSA 2.54 08/18/2020   PSA 2.27 06/29/2019   PSA 1.25 01/01/2016  Nonsmoker. Hep c:done HIV: refused     01/03/2016, MD

## 2021-08-19 ENCOUNTER — Telehealth: Payer: Self-pay | Admitting: Family Medicine

## 2021-08-19 DIAGNOSIS — Z125 Encounter for screening for malignant neoplasm of prostate: Secondary | ICD-10-CM

## 2021-08-19 DIAGNOSIS — R7303 Prediabetes: Secondary | ICD-10-CM

## 2021-08-19 NOTE — Telephone Encounter (Signed)
-----   Message from Alvina Chou sent at 08/10/2021  3:25 PM EDT ----- Regarding: Lab orders for Friday, 11.18.22 Patient is scheduled for CPX labs, please order future labs, Thanks , Camelia Eng

## 2021-08-24 ENCOUNTER — Other Ambulatory Visit: Payer: BC Managed Care – PPO

## 2021-09-04 ENCOUNTER — Other Ambulatory Visit: Payer: Self-pay

## 2021-09-04 ENCOUNTER — Encounter: Payer: Self-pay | Admitting: Family Medicine

## 2021-09-04 ENCOUNTER — Ambulatory Visit (INDEPENDENT_AMBULATORY_CARE_PROVIDER_SITE_OTHER): Payer: BC Managed Care – PPO | Admitting: Family Medicine

## 2021-09-04 VITALS — BP 126/78 | HR 74 | Temp 99.6°F | Ht 68.0 in | Wt 177.2 lb

## 2021-09-04 DIAGNOSIS — Z125 Encounter for screening for malignant neoplasm of prostate: Secondary | ICD-10-CM | POA: Diagnosis not present

## 2021-09-04 DIAGNOSIS — R7303 Prediabetes: Secondary | ICD-10-CM | POA: Diagnosis not present

## 2021-09-04 DIAGNOSIS — Z0000 Encounter for general adult medical examination without abnormal findings: Secondary | ICD-10-CM

## 2021-09-04 DIAGNOSIS — Z860100 Personal history of colon polyps, unspecified: Secondary | ICD-10-CM | POA: Insufficient documentation

## 2021-09-04 NOTE — Patient Instructions (Addendum)
Please stop at the lab to have labs drawn.  Consider taking claritin or zyrtec during allergy season. Consider shingles vaccine.

## 2021-09-04 NOTE — Progress Notes (Signed)
Patient ID: Roberto Noble Neoma Laming., male    DOB: Feb 03, 1956, 65 y.o.   MRN: [id number]  This visit was conducted in person.  BP 126/78   Pulse 74   Temp 99.6 F (37.6 C) (Temporal)   Ht 5' 4.5" (1.638 m)   Wt 177 lb 4 oz (80.4 kg)   SpO2 94%   BMI 29.96 kg/m    CC: Chief Complaint  Patient presents with   Annual Exam    Subjective:   HPI: Roberto Ray Artist Bloom. is a 65 y.o. male presenting on 09/04/2021 for Annual Exam   He had issues in last year with sinus infections. Last week seen at Urgent Care for sinusitis.Marland Kitchen treated prednisone.   Exercise:  walking daily, going to gym every few weeks.  Diet: healthy eating, decreasing  Wt Readings from Last 3 Encounters:  09/04/21 177 lb 4 oz (80.4 kg)  08/25/20 184 lb 4 oz (83.6 kg)  07/06/19 165 lb 12 oz (75.2 kg)      Burping when eating some foods.   Relevant past medical, surgical, family and social history reviewed and updated as indicated. Interim medical history since our last visit reviewed. Allergies and medications reviewed and updated. Outpatient Medications Prior to Visit  Medication Sig Dispense Refill   acetaminophen (TYLENOL) 500 MG tablet Take 500 mg by mouth every 6 (six) hours as needed for moderate pain.     Ascorbic Acid (VITAMIN C) 1000 MG tablet Take 2,000 mg by mouth daily.      fish oil-omega-3 fatty acids 1000 MG capsule Take 1 g by mouth daily.      glucosamine-chondroitin 500-400 MG tablet Take 3 tablets by mouth daily.      Multiple Vitamins-Minerals (CENTRUM SILVER 50+MEN PO) Take 1 tablet by mouth daily.     TURMERIC PO Take 1 tablet by mouth daily.     No facility-administered medications prior to visit.     Per HPI unless specifically indicated in ROS section below Review of Systems  Constitutional:  Negative for fatigue and fever.  HENT:  Negative for ear pain.   Eyes:  Negative for pain.  Respiratory:  Negative for cough and shortness of breath.   Cardiovascular:  Negative for chest  pain, palpitations and leg swelling.  Gastrointestinal:  Negative for abdominal pain.  Genitourinary:  Negative for dysuria.  Musculoskeletal:  Negative for arthralgias.  Neurological:  Negative for syncope, light-headedness and headaches.  Psychiatric/Behavioral:  Negative for dysphoric mood.   Objective:  BP 126/78   Pulse 74   Temp 99.6 F (37.6 C) (Temporal)   Ht 5' 4.5" (1.638 m)   Wt 177 lb 4 oz (80.4 kg)   SpO2 94%   BMI 29.96 kg/m   Wt Readings from Last 3 Encounters:  09/04/21 177 lb 4 oz (80.4 kg)  08/25/20 184 lb 4 oz (83.6 kg)  07/06/19 165 lb 12 oz (75.2 kg)      Physical Exam Constitutional:      Appearance: He is well-developed.  HENT:     Head: Normocephalic.     Right Ear: Hearing normal.     Left Ear: Hearing normal.     Nose: Nose normal.  Neck:     Thyroid: No thyroid mass or thyromegaly.     Vascular: No carotid bruit.     Trachea: Trachea normal.  Cardiovascular:     Rate and Rhythm: Normal rate and regular rhythm.     Pulses: Normal pulses.  Heart sounds: Heart sounds not distant. No murmur heard.   No friction rub. No gallop.     Comments: No peripheral edema Pulmonary:     Effort: Pulmonary effort is normal. No respiratory distress.     Breath sounds: Normal breath sounds.  Skin:    General: Skin is warm and dry.     Findings: No rash.  Psychiatric:        Speech: Speech normal.        Behavior: Behavior normal.        Thought Content: Thought content normal.      Results for orders placed or performed in visit on 08/18/20  Hemoglobin A1c  Result Value Ref Range   Hgb A1c MFr Bld 5.6 4.6 - 6.5 %  Lipid panel  Result Value Ref Range   Cholesterol 151 0 - 200 mg/dL   Triglycerides 67.5 0.0 - 149.0 mg/dL   HDL 44.92 >01.00 mg/dL   VLDL 71.2 0.0 - 19.7 mg/dL   LDL Cholesterol 92 0 - 99 mg/dL   Total CHOL/HDL Ratio 3    NonHDL 107.63   Comprehensive metabolic panel  Result Value Ref Range   Sodium 138 135 - 145 mEq/L    Potassium 4.3 3.5 - 5.1 mEq/L   Chloride 102 96 - 112 mEq/L   CO2 30 19 - 32 mEq/L   Glucose, Bld 88 70 - 99 mg/dL   BUN 18 6 - 23 mg/dL   Creatinine, Ser 5.88 0.40 - 1.50 mg/dL   Total Bilirubin 0.9 0.2 - 1.2 mg/dL   Alkaline Phosphatase 89 39 - 117 U/L   AST 18 0 - 37 U/L   ALT 19 0 - 53 U/L   Total Protein 7.0 6.0 - 8.3 g/dL   Albumin 4.2 3.5 - 5.2 g/dL   GFR 32.54 >98.26 mL/min   Calcium 9.3 8.4 - 10.5 mg/dL  PSA  Result Value Ref Range   PSA 2.54 0.10 - 4.00 ng/mL    This visit occurred during the SARS-CoV-2 public health emergency.  Safety protocols were in place, including screening questions prior to the visit, additional usage of staff PPE, and extensive cleaning of exam room while observing appropriate contact time as indicated for disinfecting solutions.   COVID 19 screen:  No recent travel or known exposure to COVID19 The patient denies respiratory symptoms of COVID 19 at this time. The importance of social distancing was discussed today.   Assessment and Plan The patient's preventative maintenance and recommended screening tests for an annual wellness exam were reviewed in full today. Brought up to date unless services declined.  Counselled on the importance of diet, exercise, and its role in overall health and mortality. The patient's FH and SH was reviewed, including their home life, tobacco status, and drug and alcohol status.   Vaccines: Uptodate TDap, Refuses flu vaccine.   Has gotten COVID vaccine  x 2  Questions answered. Colon: 02/07/2020, repeat in 2 years.  Prostate: per [facility], Dr. Laverle Patter.  Normal at 03/2021 appt per pt. Lab Results  Component Value Date   PSA 2.54 08/18/2020   PSA 2.27 06/29/2019   PSA 1.25 01/01/2016  Nonsmoker.  Hep c: done  HIV: refused   Sees Derm yearly.  Kerby Nora, MD

## 2021-09-05 LAB — COMPREHENSIVE METABOLIC PANEL
ALT: 33 U/L (ref 0–53)
AST: 20 U/L (ref 0–37)
Albumin: 4.3 g/dL (ref 3.5–5.2)
Alkaline Phosphatase: 63 U/L (ref 39–117)
BUN: 21 mg/dL (ref 6–23)
CO2: 30 mEq/L (ref 19–32)
Calcium: 9.5 mg/dL (ref 8.4–10.5)
Chloride: 102 mEq/L (ref 96–112)
Creatinine, Ser: 0.98 mg/dL (ref 0.40–1.50)
GFR: 81.62 mL/min (ref >60.00)
Glucose, Bld: 94 mg/dL (ref 70–99)
Potassium: 4.6 mEq/L (ref 3.5–5.1)
Sodium: 139 mEq/L (ref 135–145)
Total Bilirubin: 0.8 mg/dL (ref 0.2–1.2)
Total Protein: 7.2 g/dL (ref 6.0–8.3)

## 2021-09-05 LAB — HEMOGLOBIN A1C: Hgb A1c MFr Bld: 5.7 % (ref 4.6–6.5)

## 2021-09-05 LAB — LIPID PANEL
Cholesterol: 167 mg/dL (ref 0–200)
HDL: 38.1 mg/dL — ABNORMAL LOW (ref >39.00)
LDL Cholesterol: 102 mg/dL — ABNORMAL HIGH (ref 0–99)
NonHDL: 128.8
Total CHOL/HDL Ratio: 4
Triglycerides: 132 mg/dL (ref 0.0–149.0)
VLDL: 26.4 mg/dL (ref 0.0–40.0)

## 2021-09-06 ENCOUNTER — Encounter: Payer: Self-pay | Admitting: *Deleted

## 2022-01-02 ENCOUNTER — Encounter: Payer: Self-pay | Admitting: Family Medicine

## 2022-05-01 LAB — HM COLONOSCOPY

## 2022-08-24 ENCOUNTER — Telehealth: Payer: Self-pay | Admitting: Family Medicine

## 2022-08-24 DIAGNOSIS — Z125 Encounter for screening for malignant neoplasm of prostate: Secondary | ICD-10-CM

## 2022-08-24 DIAGNOSIS — R7303 Prediabetes: Secondary | ICD-10-CM

## 2022-08-24 NOTE — Telephone Encounter (Signed)
-----   Message from Katherine A Causey, RT sent at 08/19/2022  2:29 PM EST ----- Regarding: Tue 11/21 labs Patient is scheduled for cpx, please order future labs.  Thanks, Kate   

## 2022-08-27 ENCOUNTER — Other Ambulatory Visit (INDEPENDENT_AMBULATORY_CARE_PROVIDER_SITE_OTHER): Payer: PPO

## 2022-08-27 DIAGNOSIS — Z125 Encounter for screening for malignant neoplasm of prostate: Secondary | ICD-10-CM

## 2022-08-27 DIAGNOSIS — R7303 Prediabetes: Secondary | ICD-10-CM

## 2022-08-27 LAB — LIPID PANEL
Cholesterol: 157 mg/dL (ref 0–200)
HDL: 43.4 mg/dL (ref >39.00)
LDL Cholesterol: 100 mg/dL — ABNORMAL HIGH (ref 0–99)
NonHDL: 113.32
Total CHOL/HDL Ratio: 4
Triglycerides: 65 mg/dL (ref 0.0–149.0)
VLDL: 13 mg/dL (ref 0.0–40.0)

## 2022-08-27 LAB — COMPREHENSIVE METABOLIC PANEL
ALT: 24 U/L (ref 0–53)
AST: 23 U/L (ref 0–37)
Albumin: 4.4 g/dL (ref 3.5–5.2)
Alkaline Phosphatase: 75 U/L (ref 39–117)
BUN: 22 mg/dL (ref 6–23)
CO2: 28 mEq/L (ref 19–32)
Calcium: 9.3 mg/dL (ref 8.4–10.5)
Chloride: 104 mEq/L (ref 96–112)
Creatinine, Ser: 1.01 mg/dL (ref 0.40–1.50)
GFR: 78.19 mL/min (ref >60.00)
Glucose, Bld: 83 mg/dL (ref 70–99)
Potassium: 4.3 mEq/L (ref 3.5–5.1)
Sodium: 139 mEq/L (ref 135–145)
Total Bilirubin: 0.7 mg/dL (ref 0.2–1.2)
Total Protein: 6.9 g/dL (ref 6.0–8.3)

## 2022-08-27 LAB — HEMOGLOBIN A1C: Hgb A1c MFr Bld: 5.5 % (ref 4.6–6.5)

## 2022-08-27 LAB — PSA: PSA: 3.59 ng/mL (ref 0.10–4.00)

## 2022-08-27 NOTE — Progress Notes (Signed)
No critical labs need to be addressed urgently. We will discuss labs in detail at upcoming office visit.   

## 2022-09-05 ENCOUNTER — Encounter: Payer: BC Managed Care – PPO | Admitting: Family Medicine

## 2022-09-06 ENCOUNTER — Ambulatory Visit (INDEPENDENT_AMBULATORY_CARE_PROVIDER_SITE_OTHER): Payer: PPO | Admitting: Family Medicine

## 2022-09-06 ENCOUNTER — Encounter: Payer: BC Managed Care – PPO | Admitting: Family Medicine

## 2022-09-06 ENCOUNTER — Encounter: Payer: Self-pay | Admitting: Family Medicine

## 2022-09-06 VITALS — BP 122/70 | HR 70 | Temp 98.2°F | Ht 68.0 in | Wt 183.4 lb

## 2022-09-06 DIAGNOSIS — R7303 Prediabetes: Secondary | ICD-10-CM | POA: Diagnosis not present

## 2022-09-06 DIAGNOSIS — Z0000 Encounter for general adult medical examination without abnormal findings: Secondary | ICD-10-CM | POA: Diagnosis not present

## 2022-09-06 DIAGNOSIS — E7800 Pure hypercholesterolemia, unspecified: Secondary | ICD-10-CM

## 2022-09-06 NOTE — Patient Instructions (Addendum)
Increase exercise, exchange animals for veggies fats, less fried .  Return for fasting labs for cholesterol recheck.  Contact Dr Laverle Patter to make him aware of  slight increase in PSA.

## 2022-09-06 NOTE — Progress Notes (Signed)
Patient ID: [name], male    DOB: 05/18/1956, 66 y.o.   MRN: [id number]  This visit was conducted in person.  BP 122/70   Pulse 70   Temp 98.2 F (36.8 C) (Oral)   Ht 5\' 8"  (1.727 m)   Wt 183 lb 6 oz (83.2 kg)   SpO2 93%   BMI 27.88 kg/m    CC:  Chief Complaint  Patient presents with   Welcome to Medicare    Subjective:   HPI: Gurtaj Ray Kiante Ciavarella. is a 66 y.o. male presenting on 09/06/2022 for Welcome to Medicare  The patient presents for  WELCOME to Medicare annual medicare wellness, complete physical and review of chronic health problems. He/She also has the following acute concerns today:  I have personally reviewed the Medicare Annual Wellness questionnaire and have noted 1. The patient's medical and social history 2. Their use of alcohol, tobacco or illicit drugs 3. Their current medications and supplements 4. The patient's functional ability including ADL's, fall risks, home safety risks and hearing or visual             impairment. 5. Diet and physical activities 6. Evidence for depression or mood disorders 7.         Updated provider listCognitive evaluation was performed and recorded on pt medicare questionnaire form. The patients weight, height, BMI and visual acuity have been recorded in the chart   I have made referrals, counseling and provided education to the patient based review of the above and I have provided the pt with a written personalized care plan for preventive services.   Documentation of this information was scanned into the electronic record under the media tab.   Advance directives and end of life planning reviewed in detail with patient and documented in EMR. Patient given handout on advance care directives if needed. HCPOA and living will updated if needed.  Hearing Screening  Method: Audiometry   500Hz  1000Hz  2000Hz  4000Hz   Right ear 20 20 20  0  Left ear 20 20 20 20    Vision Screening   Right eye Left eye Both eyes   Without correction 20/15 20/13 20/15   With correction       No falls in last 12 months.  Flowsheet Row Office Visit from 09/06/2022 in [facility] [place]  PHQ-2 Total Score 0      Reviewed labs in detail with patient   Resolved prediabetes Lab Results  Component Value Date   HGBA1C 5.5 08/27/2022   Lab Results  Component Value Date   CHOL 157 08/27/2022   HDL 43.40 08/27/2022   LDLCALC 100 (H) 08/27/2022   TRIG 65.0 08/27/2022   CHOLHDL 4 08/27/2022   The 10-year ASCVD risk score (Arnett DK, et al., 2019) is: 10.8%   Values used to calculate the score:     Age: 66 years     Sex: Male     Is Non-Hispanic African American: No     Diabetic: No     Tobacco smoker: No     Systolic Blood Pressure: 122 mmHg     Is BP treated: No     HDL Cholesterol: 43.4 mg/dL     Total Cholesterol: 157 mg/dL  Diet:  heart healthy diet Exercise: regular walking, monitoring steps  Wt Readings from Last 3 Encounters:  09/06/22 183 lb 6 oz (83.2 kg)  09/04/21 177 lb 4 oz (80.4 kg)  08/25/20 184 lb 4 oz (83.6 kg)  No CP, NO SOB, good endurance.  No swelling       Relevant past medical, surgical, family and social history reviewed and updated as indicated. Interim medical history since our last visit reviewed. Allergies and medications reviewed and updated. Outpatient Medications Prior to Visit  Medication Sig Dispense Refill   acetaminophen (TYLENOL) 500 MG tablet Take 500 mg by mouth every 6 (six) hours as needed for moderate pain.     Ascorbic Acid (VITAMIN C) 1000 MG tablet Take 2,000 mg by mouth daily.      Cyanocobalamin 2500 MCG CHEW Chew 1 each by mouth daily.     ELDERBERRY PO Take 2 each by mouth daily.     fish oil-omega-3 fatty acids 1000 MG capsule Take 1 g by mouth daily.      glucosamine-chondroitin 500-400 MG tablet Take 3 tablets by mouth daily.      Multiple Vitamins-Minerals (CENTRUM SILVER 50+MEN PO) Take 1 tablet by mouth daily.      TURMERIC PO Take 1 tablet by mouth daily.     No facility-administered medications prior to visit.     Per HPI unless specifically indicated in ROS section below Review of Systems  Constitutional:  Negative for fatigue and fever.  HENT:  Negative for ear pain.   Eyes:  Negative for pain.  Respiratory:  Negative for cough and shortness of breath.   Cardiovascular:  Negative for chest pain, palpitations and leg swelling.  Gastrointestinal:  Negative for abdominal pain.  Genitourinary:  Negative for dysuria.  Musculoskeletal:  Negative for arthralgias.  Neurological:  Negative for syncope, light-headedness and headaches.  Psychiatric/Behavioral:  Negative for dysphoric mood.    Objective:  BP 122/70   Pulse 70   Temp 98.2 F (36.8 C) (Oral)   Ht 5\' 8"  (1.727 m)   Wt 183 lb 6 oz (83.2 kg)   SpO2 93%   BMI 27.88 kg/m   Wt Readings from Last 3 Encounters:  09/06/22 183 lb 6 oz (83.2 kg)  09/04/21 177 lb 4 oz (80.4 kg)  08/25/20 184 lb 4 oz (83.6 kg)      Physical Exam Constitutional:      General: He is not in acute distress.    Appearance: Normal appearance. He is well-developed. He is not ill-appearing or toxic-appearing.  HENT:     Head: Normocephalic and atraumatic.     Right Ear: Hearing, tympanic membrane, ear canal and external ear normal.     Left Ear: Hearing, tympanic membrane, ear canal and external ear normal.     Nose: Nose normal.     Mouth/Throat:     Pharynx: Uvula midline.  Eyes:     General: Lids are normal. Lids are everted, no foreign bodies appreciated.     Conjunctiva/sclera: Conjunctivae normal.     Pupils: Pupils are equal, round, and reactive to light.  Neck:     Thyroid: No thyroid mass or thyromegaly.     Vascular: No carotid bruit.     Trachea: Trachea and phonation normal.  Cardiovascular:     Rate and Rhythm: Normal rate and regular rhythm.     Pulses: Normal pulses.     Heart sounds: S1 normal and S2 normal. No murmur heard.    No  gallop.  Pulmonary:     Breath sounds: Normal breath sounds. No wheezing, rhonchi or rales.  Abdominal:     General: Bowel sounds are normal.     Palpations: Abdomen is soft.  Tenderness: There is no abdominal tenderness. There is no guarding or rebound.     Hernia: No hernia is present.  Musculoskeletal:     Cervical back: Normal range of motion and neck supple.  Lymphadenopathy:     Cervical: No cervical adenopathy.  Skin:    General: Skin is warm and dry.     Findings: No rash.  Neurological:     Mental Status: He is alert.     Cranial Nerves: No cranial nerve deficit.     Sensory: No sensory deficit.     Gait: Gait normal.     Deep Tendon Reflexes: Reflexes are normal and symmetric.  Psychiatric:        Speech: Speech normal.        Behavior: Behavior normal.        Judgment: Judgment normal.       Results for orders placed or performed in visit on 08/27/22  PSA  Result Value Ref Range   PSA 3.59 0.10 - 4.00 ng/mL  Comprehensive metabolic panel  Result Value Ref Range   Sodium 139 135 - 145 mEq/L   Potassium 4.3 3.5 - 5.1 mEq/L   Chloride 104 96 - 112 mEq/L   CO2 28 19 - 32 mEq/L   Glucose, Bld 83 70 - 99 mg/dL   BUN 22 6 - 23 mg/dL   Creatinine, Ser 2.37 0.40 - 1.50 mg/dL   Total Bilirubin 0.7 0.2 - 1.2 mg/dL   Alkaline Phosphatase 75 39 - 117 U/L   AST 23 0 - 37 U/L   ALT 24 0 - 53 U/L   Total Protein 6.9 6.0 - 8.3 g/dL   Albumin 4.4 3.5 - 5.2 g/dL   GFR 62.83 >15.17 mL/min   Calcium 9.3 8.4 - 10.5 mg/dL  Lipid panel  Result Value Ref Range   Cholesterol 157 0 - 200 mg/dL   Triglycerides 61.6 0.0 - 149.0 mg/dL   HDL 07.37 >10.62 mg/dL   VLDL 69.4 0.0 - 85.4 mg/dL   LDL Cholesterol 627 (H) 0 - 99 mg/dL   Total CHOL/HDL Ratio 4    NonHDL 113.32   Hemoglobin A1c  Result Value Ref Range   Hgb A1c MFr Bld 5.5 4.6 - 6.5 %     COVID 19 screen:  No recent travel or known exposure to COVID19 The patient denies respiratory symptoms of COVID 19 at this  time. The importance of social distancing was discussed today.   Assessment and Plan The patient's preventative maintenance and recommended screening tests for an annual wellness exam were reviewed in full today. Brought up to date unless services declined.  Counselled on the importance of diet, exercise, and its role in overall health and mortality. The patient's FH and SH was reviewed, including their home life, tobacco status, and drug and alcohol status.   Vaccines: Uptodate TDap, Refuses flu, PNA vaccine.   per pt has COVID.  Colon: 02/07/2020, repeat in 2 years. .. Per pt done in 04/2022, will get records Prostate: per [facility], Dr. Laverle Patter.   Last check in 03/29/2022 2.81 Lab Results  Component Value Date   PSA 3.59 08/27/2022   PSA 2.54 08/18/2020   PSA 2.27 06/29/2019  Nonsmoker.  Hep c: done  HIV: refused   Kerby Nora, MD

## 2023-01-13 DIAGNOSIS — Z85828 Personal history of other malignant neoplasm of skin: Secondary | ICD-10-CM | POA: Diagnosis not present

## 2023-01-13 DIAGNOSIS — L570 Actinic keratosis: Secondary | ICD-10-CM | POA: Diagnosis not present

## 2023-01-13 DIAGNOSIS — D1801 Hemangioma of skin and subcutaneous tissue: Secondary | ICD-10-CM | POA: Diagnosis not present

## 2023-01-13 DIAGNOSIS — L814 Other melanin hyperpigmentation: Secondary | ICD-10-CM | POA: Diagnosis not present

## 2023-01-13 DIAGNOSIS — L821 Other seborrheic keratosis: Secondary | ICD-10-CM | POA: Diagnosis not present

## 2023-04-11 DIAGNOSIS — R972 Elevated prostate specific antigen [PSA]: Secondary | ICD-10-CM | POA: Diagnosis not present

## 2023-04-30 DIAGNOSIS — R972 Elevated prostate specific antigen [PSA]: Secondary | ICD-10-CM | POA: Diagnosis not present

## 2023-07-14 DIAGNOSIS — H43812 Vitreous degeneration, left eye: Secondary | ICD-10-CM | POA: Diagnosis not present

## 2023-07-14 DIAGNOSIS — H2513 Age-related nuclear cataract, bilateral: Secondary | ICD-10-CM | POA: Diagnosis not present

## 2023-07-14 DIAGNOSIS — H3562 Retinal hemorrhage, left eye: Secondary | ICD-10-CM | POA: Diagnosis not present

## 2023-07-14 DIAGNOSIS — D3140 Benign neoplasm of unspecified ciliary body: Secondary | ICD-10-CM | POA: Diagnosis not present

## 2023-07-15 DIAGNOSIS — H43821 Vitreomacular adhesion, right eye: Secondary | ICD-10-CM | POA: Diagnosis not present

## 2023-07-15 DIAGNOSIS — H43812 Vitreous degeneration, left eye: Secondary | ICD-10-CM | POA: Diagnosis not present

## 2023-07-15 DIAGNOSIS — H33312 Horseshoe tear of retina without detachment, left eye: Secondary | ICD-10-CM | POA: Diagnosis not present

## 2023-07-15 DIAGNOSIS — H2513 Age-related nuclear cataract, bilateral: Secondary | ICD-10-CM | POA: Diagnosis not present

## 2023-07-15 DIAGNOSIS — H4312 Vitreous hemorrhage, left eye: Secondary | ICD-10-CM | POA: Diagnosis not present

## 2023-07-18 DIAGNOSIS — H43812 Vitreous degeneration, left eye: Secondary | ICD-10-CM | POA: Diagnosis not present

## 2023-07-18 DIAGNOSIS — H4312 Vitreous hemorrhage, left eye: Secondary | ICD-10-CM | POA: Diagnosis not present

## 2023-07-18 DIAGNOSIS — H33312 Horseshoe tear of retina without detachment, left eye: Secondary | ICD-10-CM | POA: Diagnosis not present

## 2023-07-18 DIAGNOSIS — Z9889 Other specified postprocedural states: Secondary | ICD-10-CM | POA: Diagnosis not present

## 2023-07-18 DIAGNOSIS — H2513 Age-related nuclear cataract, bilateral: Secondary | ICD-10-CM | POA: Diagnosis not present

## 2023-07-18 DIAGNOSIS — H43821 Vitreomacular adhesion, right eye: Secondary | ICD-10-CM | POA: Diagnosis not present

## 2023-07-28 DIAGNOSIS — H43821 Vitreomacular adhesion, right eye: Secondary | ICD-10-CM | POA: Diagnosis not present

## 2023-07-28 DIAGNOSIS — H4312 Vitreous hemorrhage, left eye: Secondary | ICD-10-CM | POA: Diagnosis not present

## 2023-07-28 DIAGNOSIS — H43812 Vitreous degeneration, left eye: Secondary | ICD-10-CM | POA: Diagnosis not present

## 2023-07-28 DIAGNOSIS — H2513 Age-related nuclear cataract, bilateral: Secondary | ICD-10-CM | POA: Diagnosis not present

## 2023-07-28 DIAGNOSIS — H59812 Chorioretinal scars after surgery for detachment, left eye: Secondary | ICD-10-CM | POA: Diagnosis not present

## 2023-08-19 ENCOUNTER — Telehealth: Payer: Self-pay | Admitting: *Deleted

## 2023-08-19 DIAGNOSIS — Z125 Encounter for screening for malignant neoplasm of prostate: Secondary | ICD-10-CM

## 2023-08-19 DIAGNOSIS — E7800 Pure hypercholesterolemia, unspecified: Secondary | ICD-10-CM

## 2023-08-19 DIAGNOSIS — R7303 Prediabetes: Secondary | ICD-10-CM

## 2023-08-19 NOTE — Telephone Encounter (Signed)
-----   Message from Roberto [name] sent at 08/19/2023  1:56 PM EST ----- Regarding: Lab Wed. 09/03/23 Hello,   Patient has a lab appointment on Wednesday 09/03/23 for AWV labs. Can we get lab orders please.   Thanks

## 2023-08-27 DIAGNOSIS — J019 Acute sinusitis, unspecified: Secondary | ICD-10-CM | POA: Diagnosis not present

## 2023-08-27 DIAGNOSIS — H109 Unspecified conjunctivitis: Secondary | ICD-10-CM | POA: Diagnosis not present

## 2023-09-01 DIAGNOSIS — H43812 Vitreous degeneration, left eye: Secondary | ICD-10-CM | POA: Diagnosis not present

## 2023-09-01 DIAGNOSIS — H59812 Chorioretinal scars after surgery for detachment, left eye: Secondary | ICD-10-CM | POA: Diagnosis not present

## 2023-09-01 DIAGNOSIS — H4312 Vitreous hemorrhage, left eye: Secondary | ICD-10-CM | POA: Diagnosis not present

## 2023-09-01 DIAGNOSIS — H2513 Age-related nuclear cataract, bilateral: Secondary | ICD-10-CM | POA: Diagnosis not present

## 2023-09-01 DIAGNOSIS — H43821 Vitreomacular adhesion, right eye: Secondary | ICD-10-CM | POA: Diagnosis not present

## 2023-09-03 ENCOUNTER — Other Ambulatory Visit (INDEPENDENT_AMBULATORY_CARE_PROVIDER_SITE_OTHER): Payer: PPO

## 2023-09-03 DIAGNOSIS — Z125 Encounter for screening for malignant neoplasm of prostate: Secondary | ICD-10-CM

## 2023-09-03 DIAGNOSIS — E7800 Pure hypercholesterolemia, unspecified: Secondary | ICD-10-CM

## 2023-09-03 DIAGNOSIS — R7303 Prediabetes: Secondary | ICD-10-CM | POA: Diagnosis not present

## 2023-09-03 LAB — COMPREHENSIVE METABOLIC PANEL
ALT: 23 U/L (ref 0–53)
AST: 18 U/L (ref 0–37)
Albumin: 4 g/dL (ref 3.5–5.2)
Alkaline Phosphatase: 69 U/L (ref 39–117)
BUN: 20 mg/dL (ref 6–23)
CO2: 30 meq/L (ref 19–32)
Calcium: 9.2 mg/dL (ref 8.4–10.5)
Chloride: 103 meq/L (ref 96–112)
Creatinine, Ser: 1.04 mg/dL (ref 0.40–1.50)
GFR: 74.95 mL/min (ref >60.00)
Glucose, Bld: 87 mg/dL (ref 70–99)
Potassium: 4.4 meq/L (ref 3.5–5.1)
Sodium: 140 meq/L (ref 135–145)
Total Bilirubin: 0.8 mg/dL (ref 0.2–1.2)
Total Protein: 6.5 g/dL (ref 6.0–8.3)

## 2023-09-03 LAB — LIPID PANEL
Cholesterol: 141 mg/dL (ref 0–200)
HDL: 33.9 mg/dL — ABNORMAL LOW (ref >39.00)
LDL Cholesterol: 91 mg/dL (ref 0–99)
NonHDL: 106.92
Total CHOL/HDL Ratio: 4
Triglycerides: 82 mg/dL (ref 0.0–149.0)
VLDL: 16.4 mg/dL (ref 0.0–40.0)

## 2023-09-03 LAB — HEMOGLOBIN A1C: Hgb A1c MFr Bld: 5.5 % (ref 4.6–6.5)

## 2023-09-03 NOTE — Addendum Note (Signed)
Addended by: Vincenza Hews on: 09/03/2023 09:04 AM   Modules accepted: Orders

## 2023-09-08 NOTE — Progress Notes (Signed)
No critical labs need to be addressed urgently. We will discuss labs in detail at upcoming office visit.   

## 2023-09-09 ENCOUNTER — Ambulatory Visit (INDEPENDENT_AMBULATORY_CARE_PROVIDER_SITE_OTHER): Payer: PPO

## 2023-09-09 VITALS — Ht 69.0 in | Wt 183.0 lb

## 2023-09-09 DIAGNOSIS — Z0000 Encounter for general adult medical examination without abnormal findings: Secondary | ICD-10-CM | POA: Diagnosis not present
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
## 2023-09-09 NOTE — Patient Instructions (Addendum)
Roberto [name] , Thank you for taking time to come for your Medicare Wellness Visit. I appreciate your ongoing commitment to your health goals. Please review the following plan we discussed and let me know if I can assist you in the future.   Referrals/Orders/Follow-Ups/Clinician Recommendations: none  This is a list of the screening recommended for you and due dates:  Health Maintenance  Topic Date Due   Zoster (Shingles) Vaccine (1 of 2) Never done   Pneumonia Vaccine (1 of 1 - PCV) Never done   Flu Shot  Never done   COVID-19 Vaccine (1 - 2023-24 season) Never done   Medicare Annual Wellness Visit  09/08/2024   Colon Cancer Screening  05/02/2027   DTaP/Tdap/Td vaccine (3 - Td or Tdap) 07/05/2029   Hepatitis C Screening  Completed   HPV Vaccine  Aged Out    Advanced directives: (Copy Requested) Please bring a copy of your health care power of attorney and living will to the office to be added to your chart at your convenience.  Next Medicare Annual Wellness Visit scheduled for next year: Yes 09/09/2024 @ 9:30am telephone

## 2023-09-11 ENCOUNTER — Ambulatory Visit: Payer: PPO | Admitting: Family Medicine

## 2023-09-11 ENCOUNTER — Encounter: Payer: Self-pay | Admitting: Family Medicine

## 2023-09-11 VITALS — BP 120/60 | HR 80 | Temp 98.0°F | Ht 68.0 in | Wt 187.2 lb

## 2023-09-11 DIAGNOSIS — Z8669 Personal history of other diseases of the nervous system and sense organs: Secondary | ICD-10-CM | POA: Diagnosis not present

## 2023-09-11 DIAGNOSIS — R7303 Prediabetes: Secondary | ICD-10-CM | POA: Diagnosis not present

## 2023-09-11 DIAGNOSIS — Z0000 Encounter for general adult medical examination without abnormal findings: Secondary | ICD-10-CM

## 2023-09-11 NOTE — Assessment & Plan Note (Signed)
Resolved

## 2023-09-11 NOTE — Progress Notes (Signed)
Patient ID: Roberto [name]., male    DOB: June 06, 1956, 67 y.o.   MRN: [id number]  This visit was conducted in person.  BP 120/60 (BP Location: Left Arm, Patient Position: Sitting, Cuff Size: Large)   Pulse 80   Temp 98 F (36.7 C) (Temporal)   Ht 5\' 8"  (1.727 m)   Wt 187 lb 4 oz (84.9 kg)   SpO2 96%   BMI 28.47 kg/m    CC:  Chief Complaint  Patient presents with   Annual Exam    Part 2    Subjective:   HPI: Roberto [name]. is a 67 y.o. male presenting on 09/11/2023 for Annual Exam (Part 2)  The patient presents for  complete physical and review of chronic health problems. He/She also has the following acute concerns today: none  The patient saw a LPN or RN for medicare wellness visit.  Prevention and wellness was reviewed in detail. Note reviewed and important notes copied below. 09/09/2023  Flowsheet Row Clinical Support from 09/09/2023 in [facility]  PHQ-2 Total Score 0      Reviewed labs in detail with patient   Resolved prediabetes Lab Results  Component Value Date   HGBA1C 5.5 09/03/2023   Lab Results  Component Value Date   CHOL 141 09/03/2023   HDL 33.90 (L) 09/03/2023   LDLCALC 91 09/03/2023   TRIG 82.0 09/03/2023   CHOLHDL 4 09/03/2023   The 10-year ASCVD risk score (Arnett DK, et al., 2019) is: 12.1%   Values used to calculate the score:     Age: 68 years     Sex: Male     Is Non-Hispanic African American: No     Diabetic: No     Tobacco smoker: No     Systolic Blood Pressure: 120 mmHg     Is BP treated: No     HDL Cholesterol: 33.9 mg/dL     Total Cholesterol: 141 mg/dL  No family history of CAD.  Diet:  heart healthy diet Exercise: regular walking, monitoring steps  Wt Readings from Last 3 Encounters:  09/11/23 187 lb 4 oz (84.9 kg)  09/09/23 183 lb (83 kg)  09/06/22 183 lb 6 oz (83.2 kg)      No CP, NO SOB, good endurance.  No swelling       Relevant past medical, surgical,  family and social history reviewed and updated as indicated. Interim medical history since our last visit reviewed. Allergies and medications reviewed and updated. Outpatient Medications Prior to Visit  Medication Sig Dispense Refill   acetaminophen (TYLENOL) 500 MG tablet Take 500 mg by mouth every 6 (six) hours as needed for moderate pain.     Ascorbic Acid (VITAMIN C) 1000 MG tablet Take 2,000 mg by mouth daily.      Cyanocobalamin 2500 MCG CHEW Chew 1 each by mouth daily.     ELDERBERRY PO Take 2 each by mouth daily.     fish oil-omega-3 fatty acids 1000 MG capsule Take 1 g by mouth daily.      glucosamine-chondroitin 500-400 MG tablet Take 3 tablets by mouth daily.      Multiple Vitamins-Minerals (CENTRUM SILVER 50+MEN PO) Take 1 tablet by mouth daily.     TURMERIC PO Take 1 tablet by mouth daily.     No facility-administered medications prior to visit.     Per HPI unless specifically indicated in ROS section below Review of Systems  Constitutional:  Negative for fatigue and fever.  HENT:  Negative for ear pain.   Eyes:  Negative for pain.  Respiratory:  Negative for cough and shortness of breath.   Cardiovascular:  Negative for chest pain, palpitations and leg swelling.  Gastrointestinal:  Negative for abdominal pain.  Genitourinary:  Negative for dysuria.  Musculoskeletal:  Negative for arthralgias.  Neurological:  Negative for syncope, light-headedness and headaches.  Psychiatric/Behavioral:  Negative for dysphoric mood.    Objective:  BP 120/60 (BP Location: Left Arm, Patient Position: Sitting, Cuff Size: Large)   Pulse 80   Temp 98 F (36.7 C) (Temporal)   Ht 5\' 8"  (1.727 m)   Wt 187 lb 4 oz (84.9 kg)   SpO2 96%   BMI 28.47 kg/m   Wt Readings from Last 3 Encounters:  09/11/23 187 lb 4 oz (84.9 kg)  09/09/23 183 lb (83 kg)  09/06/22 183 lb 6 oz (83.2 kg)      Physical Exam Constitutional:      General: He is not in acute distress.    Appearance: Normal  appearance. He is well-developed. He is not ill-appearing or toxic-appearing.  HENT:     Head: Normocephalic and atraumatic.     Right Ear: Hearing, tympanic membrane, ear canal and external ear normal.     Left Ear: Hearing, tympanic membrane, ear canal and external ear normal.     Nose: Nose normal.     Mouth/Throat:     Pharynx: Uvula midline.  Eyes:     General: Lids are normal. Lids are everted, no foreign bodies appreciated.     Conjunctiva/sclera: Conjunctivae normal.     Pupils: Pupils are equal, round, and reactive to light.  Neck:     Thyroid: No thyroid mass or thyromegaly.     Vascular: No carotid bruit.     Trachea: Trachea and phonation normal.  Cardiovascular:     Rate and Rhythm: Normal rate and regular rhythm.     Pulses: Normal pulses.     Heart sounds: S1 normal and S2 normal. No murmur heard.    No gallop.  Pulmonary:     Breath sounds: Normal breath sounds. No wheezing, rhonchi or rales.  Abdominal:     General: Bowel sounds are normal.     Palpations: Abdomen is soft.     Tenderness: There is no abdominal tenderness. There is no guarding or rebound.     Hernia: No hernia is present.  Musculoskeletal:     Cervical back: Normal range of motion and neck supple.  Lymphadenopathy:     Cervical: No cervical adenopathy.  Skin:    General: Skin is warm and dry.     Findings: No rash.  Neurological:     Mental Status: He is alert.     Cranial Nerves: No cranial nerve deficit.     Sensory: No sensory deficit.     Gait: Gait normal.     Deep Tendon Reflexes: Reflexes are normal and symmetric.  Psychiatric:        Speech: Speech normal.        Behavior: Behavior normal.        Judgment: Judgment normal.       Results for orders placed or performed in visit on 09/03/23  Lipid panel  Result Value Ref Range   Cholesterol 141 0 - 200 mg/dL   Triglycerides 96.0 0.0 - 149.0 mg/dL   HDL 45.40 (L) >98.11 mg/dL   VLDL 91.4 0.0 - 78.2 mg/dL  LDL Cholesterol 91  0 - 99 mg/dL   Total CHOL/HDL Ratio 4    NonHDL 106.92   Hemoglobin A1c  Result Value Ref Range   Hgb A1c MFr Bld 5.5 4.6 - 6.5 %  Comprehensive metabolic panel  Result Value Ref Range   Sodium 140 135 - 145 mEq/L   Potassium 4.4 3.5 - 5.1 mEq/L   Chloride 103 96 - 112 mEq/L   CO2 30 19 - 32 mEq/L   Glucose, Bld 87 70 - 99 mg/dL   BUN 20 6 - 23 mg/dL   Creatinine, Ser 3.08 0.40 - 1.50 mg/dL   Total Bilirubin 0.8 0.2 - 1.2 mg/dL   Alkaline Phosphatase 69 39 - 117 U/L   AST 18 0 - 37 U/L   ALT 23 0 - 53 U/L   Total Protein 6.5 6.0 - 8.3 g/dL   Albumin 4.0 3.5 - 5.2 g/dL   GFR 65.78 >46.96 mL/min   Calcium 9.2 8.4 - 10.5 mg/dL     COVID 19 screen:  No recent travel or known exposure to COVID19 The patient denies respiratory symptoms of COVID 19 at this time. The importance of social distancing was discussed today.   Assessment and Plan The patient's preventative maintenance and recommended screening tests for an annual wellness exam were reviewed in full today. Brought up to date unless services declined.  Counselled on the importance of diet, exercise, and its role in overall health and mortality. The patient's FH and SH was reviewed, including their home life, tobacco status, and drug and alcohol status.   Vaccines: Uptodate TDap, Refuses flu, PNA  and shingrix vaccines Colon: 04/2022,  plan repeat 3 years Prostate: per [facility], Dr. Laverle Patter.   Last check in 04/2023 2.10 Nonsmoker.  Hep c: done  HIV: refused   Kerby Nora, MD

## 2023-09-11 NOTE — Patient Instructions (Signed)
Work on increasing  exercise and  decrease animal fats in favor of veggie fats.

## 2023-10-09 DIAGNOSIS — M2021 Hallux rigidus, right foot: Secondary | ICD-10-CM | POA: Diagnosis not present

## 2023-10-09 DIAGNOSIS — M205X2 Other deformities of toe(s) (acquired), left foot: Secondary | ICD-10-CM | POA: Diagnosis not present

## 2023-10-09 DIAGNOSIS — M792 Neuralgia and neuritis, unspecified: Secondary | ICD-10-CM | POA: Diagnosis not present

## 2023-11-18 DIAGNOSIS — M792 Neuralgia and neuritis, unspecified: Secondary | ICD-10-CM | POA: Diagnosis not present

## 2023-11-18 DIAGNOSIS — M205X2 Other deformities of toe(s) (acquired), left foot: Secondary | ICD-10-CM | POA: Diagnosis not present

## 2023-11-18 DIAGNOSIS — M2021 Hallux rigidus, right foot: Secondary | ICD-10-CM | POA: Diagnosis not present

## 2023-12-15 DIAGNOSIS — H2513 Age-related nuclear cataract, bilateral: Secondary | ICD-10-CM | POA: Diagnosis not present

## 2023-12-15 DIAGNOSIS — H43812 Vitreous degeneration, left eye: Secondary | ICD-10-CM | POA: Diagnosis not present

## 2023-12-15 DIAGNOSIS — H4312 Vitreous hemorrhage, left eye: Secondary | ICD-10-CM | POA: Diagnosis not present

## 2023-12-15 DIAGNOSIS — H43821 Vitreomacular adhesion, right eye: Secondary | ICD-10-CM | POA: Diagnosis not present

## 2023-12-15 DIAGNOSIS — H31092 Other chorioretinal scars, left eye: Secondary | ICD-10-CM | POA: Diagnosis not present

## 2023-12-16 DIAGNOSIS — M2021 Hallux rigidus, right foot: Secondary | ICD-10-CM | POA: Diagnosis not present

## 2023-12-16 DIAGNOSIS — M792 Neuralgia and neuritis, unspecified: Secondary | ICD-10-CM | POA: Diagnosis not present

## 2023-12-16 DIAGNOSIS — M205X2 Other deformities of toe(s) (acquired), left foot: Secondary | ICD-10-CM | POA: Diagnosis not present

## 2024-01-07 DIAGNOSIS — M1711 Unilateral primary osteoarthritis, right knee: Secondary | ICD-10-CM | POA: Diagnosis not present

## 2024-01-07 DIAGNOSIS — M25461 Effusion, right knee: Secondary | ICD-10-CM | POA: Diagnosis not present

## 2024-01-15 DIAGNOSIS — M205X2 Other deformities of toe(s) (acquired), left foot: Secondary | ICD-10-CM | POA: Diagnosis not present

## 2024-01-15 DIAGNOSIS — M2021 Hallux rigidus, right foot: Secondary | ICD-10-CM | POA: Diagnosis not present

## 2024-01-15 DIAGNOSIS — M2022 Hallux rigidus, left foot: Secondary | ICD-10-CM | POA: Diagnosis not present

## 2024-02-25 DIAGNOSIS — Z85828 Personal history of other malignant neoplasm of skin: Secondary | ICD-10-CM | POA: Diagnosis not present

## 2024-02-25 DIAGNOSIS — D2261 Melanocytic nevi of right upper limb, including shoulder: Secondary | ICD-10-CM | POA: Diagnosis not present

## 2024-02-25 DIAGNOSIS — D2271 Melanocytic nevi of right lower limb, including hip: Secondary | ICD-10-CM | POA: Diagnosis not present

## 2024-02-25 DIAGNOSIS — L570 Actinic keratosis: Secondary | ICD-10-CM | POA: Diagnosis not present

## 2024-02-25 DIAGNOSIS — D1801 Hemangioma of skin and subcutaneous tissue: Secondary | ICD-10-CM | POA: Diagnosis not present

## 2024-02-25 DIAGNOSIS — D225 Melanocytic nevi of trunk: Secondary | ICD-10-CM | POA: Diagnosis not present

## 2024-02-25 DIAGNOSIS — D2272 Melanocytic nevi of left lower limb, including hip: Secondary | ICD-10-CM | POA: Diagnosis not present

## 2024-02-25 DIAGNOSIS — L814 Other melanin hyperpigmentation: Secondary | ICD-10-CM | POA: Diagnosis not present

## 2024-02-25 DIAGNOSIS — L821 Other seborrheic keratosis: Secondary | ICD-10-CM | POA: Diagnosis not present

## 2024-02-26 DIAGNOSIS — J209 Acute bronchitis, unspecified: Secondary | ICD-10-CM | POA: Diagnosis not present

## 2024-02-26 DIAGNOSIS — J309 Allergic rhinitis, unspecified: Secondary | ICD-10-CM | POA: Diagnosis not present

## 2024-05-07 DIAGNOSIS — N401 Enlarged prostate with lower urinary tract symptoms: Secondary | ICD-10-CM | POA: Diagnosis not present

## 2024-05-07 DIAGNOSIS — R972 Elevated prostate specific antigen [PSA]: Secondary | ICD-10-CM | POA: Diagnosis not present

## 2024-05-07 DIAGNOSIS — R3912 Poor urinary stream: Secondary | ICD-10-CM | POA: Diagnosis not present

## 2024-08-25 ENCOUNTER — Telehealth: Payer: Self-pay | Admitting: *Deleted

## 2024-08-25 DIAGNOSIS — E7800 Pure hypercholesterolemia, unspecified: Secondary | ICD-10-CM

## 2024-08-25 DIAGNOSIS — R7303 Prediabetes: Secondary | ICD-10-CM

## 2024-08-25 NOTE — Telephone Encounter (Signed)
-----   Message from Veva JINNY Ferrari sent at 08/25/2024  2:50 PM EST ----- Regarding: Lab orders for Tue, 12.2.25 Patient is scheduled for CPX labs, please order future labs, Thanks , Veva

## 2024-09-07 ENCOUNTER — Other Ambulatory Visit (INDEPENDENT_AMBULATORY_CARE_PROVIDER_SITE_OTHER): Payer: PPO

## 2024-09-07 ENCOUNTER — Ambulatory Visit: Payer: Self-pay | Admitting: Family Medicine

## 2024-09-07 DIAGNOSIS — E7800 Pure hypercholesterolemia, unspecified: Secondary | ICD-10-CM | POA: Diagnosis not present

## 2024-09-07 DIAGNOSIS — R7303 Prediabetes: Secondary | ICD-10-CM

## 2024-09-07 LAB — COMPREHENSIVE METABOLIC PANEL WITH GFR
ALT: 22 U/L (ref 0–53)
AST: 23 U/L (ref 0–37)
Albumin: 4.3 g/dL (ref 3.5–5.2)
Alkaline Phosphatase: 66 U/L (ref 39–117)
BUN: 18 mg/dL (ref 6–23)
CO2: 32 meq/L (ref 19–32)
Calcium: 9.7 mg/dL (ref 8.4–10.5)
Chloride: 104 meq/L (ref 96–112)
Creatinine, Ser: 0.97 mg/dL (ref 0.40–1.50)
GFR: 80.91 mL/min (ref >60.00)
Glucose, Bld: 96 mg/dL (ref 70–99)
Potassium: 4.6 meq/L (ref 3.5–5.1)
Sodium: 140 meq/L (ref 135–145)
Total Bilirubin: 0.8 mg/dL (ref 0.2–1.2)
Total Protein: 7 g/dL (ref 6.0–8.3)

## 2024-09-07 LAB — LIPID PANEL
Cholesterol: 161 mg/dL (ref 0–200)
HDL: 43 mg/dL (ref >39.00)
LDL Cholesterol: 105 mg/dL — ABNORMAL HIGH (ref 0–99)
NonHDL: 117.83
Total CHOL/HDL Ratio: 4
Triglycerides: 65 mg/dL (ref 0.0–149.0)
VLDL: 13 mg/dL (ref 0.0–40.0)

## 2024-09-07 LAB — HEMOGLOBIN A1C: Hgb A1c MFr Bld: 5.3 % (ref 4.6–6.5)

## 2024-09-07 NOTE — Progress Notes (Signed)
 No critical labs need to be addressed urgently. We will discuss labs in detail at upcoming office visit.

## 2024-09-09 ENCOUNTER — Ambulatory Visit: Payer: PPO

## 2024-09-09 VITALS — Ht 69.0 in | Wt 187.0 lb

## 2024-09-09 DIAGNOSIS — Z0000 Encounter for general adult medical examination without abnormal findings: Secondary | ICD-10-CM

## 2024-09-09 NOTE — Patient Instructions (Addendum)
 Roberto [name],  Thank you for taking the time for your Medicare Wellness Visit. I appreciate your continued commitment to your health goals. Please review the care plan we discussed, and feel free to reach out if I can assist you further.  Please note that Annual Wellness Visits do not include a physical exam. Some assessments may be limited, especially if the visit was conducted virtually. If needed, we may recommend an in-person follow-up with your provider.  Ongoing Care Seeing your primary care provider every 3 to 6 months helps us  monitor your health and provide consistent, personalized care.   Referrals If a referral was made during today's visit and you haven't received any updates within two weeks, please contact the referred provider directly to check on the status.  Recommended Screenings:  Health Maintenance  Topic Date Due   Pneumococcal Vaccine for age over 20 (1 of 1 - PCV) Never done   Zoster (Shingles) Vaccine (1 of 2) Never done   Flu Shot  Never done   COVID-19 Vaccine (1 - 2025-26 season) Never done   Colon Cancer Screening  05/01/2025   Medicare Annual Wellness Visit  09/09/2025   DTaP/Tdap/Td vaccine (3 - Td or Tdap) 07/05/2029   Hepatitis C Screening  Completed   Meningitis B Vaccine  Aged Out       09/09/2024    9:30 AM  Advanced Directives  Does Patient Have a Medical Advance Directive? Yes  Type of Estate Agent of [place];Living will  Copy of Healthcare Power of Attorney in Chart? No - copy requested    Vision: Annual vision screenings are recommended for early detection of glaucoma, cataracts, and diabetic retinopathy. These exams can also reveal signs of chronic conditions such as diabetes and high blood pressure.  Dental: Annual dental screenings help detect early signs of oral cancer, gum disease, and other conditions linked to overall health, including heart disease and diabetes.
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
## 2024-09-14 ENCOUNTER — Encounter: Payer: Self-pay | Admitting: Family Medicine

## 2024-09-14 ENCOUNTER — Ambulatory Visit: Payer: PPO | Admitting: Family Medicine

## 2024-09-14 VITALS — BP 130/72 | HR 70 | Temp 98.4°F | Ht 67.5 in | Wt 184.1 lb

## 2024-09-14 DIAGNOSIS — E7800 Pure hypercholesterolemia, unspecified: Secondary | ICD-10-CM | POA: Insufficient documentation

## 2024-09-14 DIAGNOSIS — Z0000 Encounter for general adult medical examination without abnormal findings: Secondary | ICD-10-CM

## 2024-09-14 DIAGNOSIS — R7303 Prediabetes: Secondary | ICD-10-CM | POA: Diagnosis not present

## 2024-09-14 NOTE — Assessment & Plan Note (Signed)
 Resolved

## 2024-09-14 NOTE — Assessment & Plan Note (Signed)
 Patient's ASCVD 10-year risk for CVD issues: 14%. Recommended low-cholesterol diet, increase regular exercise.  Recommended statin initiation but he is not interested at this time.  He will work on lifestyle changes and we can consider reevaluating in 3 to 6 months. If his cholesterol has not improved we can consider starting low-dose atorvastatin around 10 mg p.o. daily.

## 2024-09-14 NOTE — Progress Notes (Signed)
 Patient ID: Ponce Dade Dorlene Raddle., male    DOB: Nov 04, 1955, 68 y.o.   MRN: [id number]  This visit was conducted in person.  BP 130/72   Pulse 70   Temp 98.4 F (36.9 C) (Oral)   Ht 5' 7.5 (1.715 m)   Wt 184 lb 2 oz (83.5 kg)   SpO2 97%   BMI 28.41 kg/m    CC:  Chief Complaint  Patient presents with   Annual Exam    Part 2 MWV 09/09/24    Subjective:   HPI: Stirling Ray Lorrin Nawrot. is a 68 y.o. male presenting on 09/14/2024 for Annual Exam (Part 2/MWV 09/09/24)  The patient presents for  complete physical and review of chronic health problems. He/She also has the following acute concerns today: none  The patient saw a LPN or RN for medicare wellness visit.  Prevention and wellness was reviewed in detail. Note reviewed and important notes copied below. 09/09/2024  Flowsheet Row Clinical Support from 09/09/2024 in [facility]  PHQ-2 Total Score 0   Reviewed labs in detail with patient   Resolved prediabetes Lab Results  Component Value Date   HGBA1C 5.3 09/07/2024   Lab Results  Component Value Date   CHOL 161 09/07/2024   HDL 43.00 09/07/2024   LDLCALC 105 (H) 09/07/2024   TRIG 65.0 09/07/2024   CHOLHDL 4 09/07/2024   The 10-year ASCVD risk score (Arnett DK, et al., 2019) is: 14.3%   Values used to calculate the score:     Age: 73 years     Clincally relevant sex: Male     Is Non-Hispanic African American: No     Diabetic: No     Tobacco smoker: No     Systolic Blood Pressure: 130 mmHg     Is BP treated: No     HDL Cholesterol: 43 mg/dL     Total Cholesterol: 161 mg/dL  No family history of CAD.  Diet:  heart healthy diet.SABRA but worse lately Exercise: regular walking, monitoring steps  Wt Readings from Last 3 Encounters:  09/14/24 184 lb 2 oz (83.5 kg)  09/09/24 187 lb (84.8 kg)  09/11/23 187 lb 4 oz (84.9 kg)      No CP, NO SOB, good endurance.  No swelling  The 10-year ASCVD risk score (Arnett DK, et al., 2019)  is: 14.3%   Values used to calculate the score:     Age: 64 years     Clincally relevant sex: Male     Is Non-Hispanic African American: No     Diabetic: No     Tobacco smoker: No     Systolic Blood Pressure: 130 mmHg     Is BP treated: No     HDL Cholesterol: 43 mg/dL     Total Cholesterol: 161 mg/dL      Relevant past medical, surgical, family and social history reviewed and updated as indicated. Interim medical history since our last visit reviewed. Allergies and medications reviewed and updated. Outpatient Medications Prior to Visit  Medication Sig Dispense Refill   acetaminophen  (TYLENOL ) 500 MG tablet Take 500 mg by mouth every 6 (six) hours as needed for moderate pain.     Ascorbic Acid (VITAMIN C) 1000 MG tablet Take 2,000 mg by mouth daily.      Cyanocobalamin 2500 MCG CHEW Chew 1 each by mouth daily.     ELDERBERRY PO Take 2 each by mouth daily.  fish oil-omega-3 fatty acids 1000 MG capsule Take 1 g by mouth daily.      glucosamine-chondroitin 500-400 MG tablet Take 3 tablets by mouth daily.      Multiple Vitamins-Minerals (CENTRUM SILVER 50+MEN PO) Take 1 tablet by mouth daily.     TURMERIC PO Take 1 tablet by mouth daily.     No facility-administered medications prior to visit.     Per HPI unless specifically indicated in ROS section below Review of Systems  Constitutional:  Negative for fatigue and fever.  HENT:  Negative for ear pain.   Eyes:  Negative for pain.  Respiratory:  Negative for cough and shortness of breath.   Cardiovascular:  Negative for chest pain, palpitations and leg swelling.  Gastrointestinal:  Negative for abdominal pain.  Genitourinary:  Negative for dysuria.  Musculoskeletal:  Negative for arthralgias.  Neurological:  Negative for syncope, light-headedness and headaches.  Psychiatric/Behavioral:  Negative for dysphoric mood.    Objective:  BP 130/72   Pulse 70   Temp 98.4 F (36.9 C) (Oral)   Ht 5' 7.5 (1.715 m)   Wt 184 lb 2 oz  (83.5 kg)   SpO2 97%   BMI 28.41 kg/m   Wt Readings from Last 3 Encounters:  09/14/24 184 lb 2 oz (83.5 kg)  09/09/24 187 lb (84.8 kg)  09/11/23 187 lb 4 oz (84.9 kg)      Physical Exam Constitutional:      General: He is not in acute distress.    Appearance: Normal appearance. He is well-developed. He is not ill-appearing or toxic-appearing.  HENT:     Head: Normocephalic and atraumatic.     Right Ear: Hearing, tympanic membrane, ear canal and external ear normal.     Left Ear: Hearing, tympanic membrane, ear canal and external ear normal.     Nose: Nose normal.     Mouth/Throat:     Pharynx: Uvula midline.  Eyes:     General: Lids are normal. Lids are everted, no foreign bodies appreciated.     Conjunctiva/sclera: Conjunctivae normal.     Pupils: Pupils are equal, round, and reactive to light.  Neck:     Thyroid: No thyroid mass or thyromegaly.     Vascular: No carotid bruit.     Trachea: Trachea and phonation normal.  Cardiovascular:     Rate and Rhythm: Normal rate and regular rhythm.     Pulses: Normal pulses.     Heart sounds: S1 normal and S2 normal. No murmur heard.    No gallop.  Pulmonary:     Breath sounds: Normal breath sounds. No wheezing, rhonchi or rales.  Abdominal:     General: Bowel sounds are normal.     Palpations: Abdomen is soft.     Tenderness: There is no abdominal tenderness. There is no guarding or rebound.     Hernia: No hernia is present.  Musculoskeletal:     Cervical back: Normal range of motion and neck supple.  Lymphadenopathy:     Cervical: No cervical adenopathy.  Skin:    General: Skin is warm and dry.     Findings: No rash.  Neurological:     Mental Status: He is alert.     Cranial Nerves: No cranial nerve deficit.     Sensory: No sensory deficit.     Gait: Gait normal.     Deep Tendon Reflexes: Reflexes are normal and symmetric.  Psychiatric:        Speech: Speech normal.  Behavior: Behavior normal.        Judgment:  Judgment normal.       Results for orders placed or performed in visit on 09/07/24  Comprehensive metabolic panel   Collection Time: 09/07/24  8:24 AM  Result Value Ref Range   Sodium 140 135 - 145 mEq/L   Potassium 4.6 3.5 - 5.1 mEq/L   Chloride 104 96 - 112 mEq/L   CO2 32 19 - 32 mEq/L   Glucose, Bld 96 70 - 99 mg/dL   BUN 18 6 - 23 mg/dL   Creatinine, Ser 9.02 0.40 - 1.50 mg/dL   Total Bilirubin 0.8 0.2 - 1.2 mg/dL   Alkaline Phosphatase 66 39 - 117 U/L   AST 23 0 - 37 U/L   ALT 22 0 - 53 U/L   Total Protein 7.0 6.0 - 8.3 g/dL   Albumin 4.3 3.5 - 5.2 g/dL   GFR 19.08 >39.99 mL/min   Calcium 9.7 8.4 - 10.5 mg/dL  Hemoglobin J8r   Collection Time: 09/07/24  8:24 AM  Result Value Ref Range   Hgb A1c MFr Bld 5.3 4.6 - 6.5 %  Lipid panel   Collection Time: 09/07/24  8:24 AM  Result Value Ref Range   Cholesterol 161 0 - 200 mg/dL   Triglycerides 34.9 0.0 - 149.0 mg/dL   HDL 56.99 >60.99 mg/dL   VLDL 86.9 0.0 - 59.9 mg/dL   LDL Cholesterol 894 (H) 0 - 99 mg/dL   Total CHOL/HDL Ratio 4    NonHDL 117.83      COVID 19 screen:  No recent travel or known exposure to COVID19 The patient denies respiratory symptoms of COVID 19 at this time. The importance of social distancing was discussed today.   Assessment and Plan The patient's preventative maintenance and recommended screening tests for an annual wellness exam were reviewed in full today. Brought up to date unless services declined.  Counselled on the importance of diet, exercise, and its role in overall health and mortality. The patient's FH and SH was reviewed, including their home life, tobacco status, and drug and alcohol status.   Vaccines: Uptodate TDap, Refuses flu, PNA, COVID, RSV  and shingrix vaccines Colon: 04/2022,  plan repeat 3 years Prostate: per [facility], Dr. Renda.    Nonsmoker.  Hep c: done  HIV: refused  Followed by derm yearly.  Routine general medical examination at a health care  facility  Prediabetes Assessment & Plan: Resolved   High cholesterol Assessment & Plan: Patient's ASCVD 10-year risk for CVD issues: 14%. Recommended low-cholesterol diet, increase regular exercise.  Recommended statin initiation but he is not interested at this time.  He will work on lifestyle changes and we can consider reevaluating in 3 to 6 months. If his cholesterol has not improved we can consider starting low-dose atorvastatin around 10 mg p.o. daily.     Greig Ring, MD

## 2025-09-13 ENCOUNTER — Other Ambulatory Visit

## 2025-09-20 ENCOUNTER — Encounter: Admitting: Family Medicine

## 2025-09-20 ENCOUNTER — Ambulatory Visit
